# Patient Record
Sex: Female | Born: 1990 | Race: White | Hispanic: Yes | Marital: Married | Smoking: Never smoker
Health system: Southern US, Community
[De-identification: ages and names within clinical notes are randomized; demographics above are authoritative.]

## PROBLEM LIST (undated history)

## (undated) ENCOUNTER — Inpatient Hospital Stay (HOSPITAL_COMMUNITY): Payer: Self-pay

## (undated) DIAGNOSIS — Z789 Other specified health status: Secondary | ICD-10-CM

## (undated) HISTORY — PX: NO PAST SURGERIES: SHX2092

---
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
## 2011-02-16 LAB — CBC
Hemoglobin: 10.3 g/dL — ABNORMAL LOW (ref 12.0–15.0)
Hemoglobin: 7.6 g/dL — ABNORMAL LOW (ref 12.0–15.0)
MCH: 24.9 pg — ABNORMAL LOW (ref 26.0–34.0)
MCH: 24.9 pg — ABNORMAL LOW (ref 26.0–34.0)
MCHC: 32.8 g/dL (ref 30.0–36.0)
MCV: 75.2 fL — ABNORMAL LOW (ref 78.0–100.0)
Platelets: 220 10*3/uL (ref 150–400)
RBC: 3.07 MIL/uL — ABNORMAL LOW (ref 3.87–5.11)
RBC: 4.14 MIL/uL (ref 3.87–5.11)
RDW: 16.5 % — ABNORMAL HIGH (ref 11.5–15.5)
WBC: 6 10*3/uL (ref 4.0–10.5)

## 2011-03-05 LAB — URINALYSIS, ROUTINE W REFLEX MICROSCOPIC
Ketones, ur: NEGATIVE mg/dL
Nitrite: NEGATIVE
Protein, ur: NEGATIVE mg/dL

## 2011-03-05 LAB — POCT URINALYSIS DIP (DEVICE)
Bilirubin Urine: NEGATIVE
Glucose, UA: NEGATIVE mg/dL
Hgb urine dipstick: NEGATIVE
Ketones, ur: NEGATIVE mg/dL
Specific Gravity, Urine: 1.015 (ref 1.005–1.030)
pH: 8.5 — ABNORMAL HIGH (ref 5.0–8.0)

## 2011-03-05 LAB — GC/CHLAMYDIA PROBE AMP, GENITAL: GC Probe Amp, Genital: NEGATIVE

## 2011-04-18 NOTE — Discharge Summary (Signed)
NAME:  Destiny Horn, Destiny Horn                     ACCOUNT NO.:  [id number]   MEDICAL RECORD NO.:  [id number]                   PATIENT TYPE:  INP   LOCATION:  0105                                 FACILITY:  [facility]   PHYSICIAN:  Beverly Milch, MD                  DATE OF BIRTH:  01/31/1991   DATE OF ADMISSION:  06/11/2004  DATE OF DISCHARGE:  06/17/2004                                 DISCHARGE SUMMARY   IDENTIFICATION:  A 20 year old female who completed 5th grade an [employer] is admitted emergently, voluntarily on referral from Dr. Marda Stalker for inpatient stabilization of command auditory hallucinations to  kill herself or others.  The patient had been in therapy for approximately 3  months with Lauris Poag, M.A., LCSW, approximately weekly.  Dr.  Orson Aloe follows the patient closely and the patient is medically stable  and on no medications.  There is significant dynamic conflicts in the family  structure and system, with father leaving the family 6 years ago as an  alcoholic, and the patient missing him and mother likely overwhelmed as  father will not pay child support.  The patient feels somewhat unattended by  mother, having to provide some parenting to the younger siblings herself,  though the aunt tries to support the patient.  For full details please see  the typed admission assessment.   SYNOPSIS OF PRESENT ILLNESS:  Mother does not speak English but the patient  is bilingual.  The patient apparently fights frequently with 35 year old  sister and is closest to 5-year-old brother.  The patient seems depressed at  the time of admission to mother and maternal uncle who lives with the  family.  Mother does not acknowledge anxiety but seems to have significant  anxiety.  The patient is obsessive about the murder death of the Latin music  star Selina and stars at pictures and listens to music, sometimes for hours.  The patient appears to have learning  difficulties and/or attention deficit.  The patient has suggested that the command auditory hallucinations that are  getting worse are of deceased relatives, though mother's faith healing  undertaking was not successful.  Mother seems to question whether the  patient may be possessed at times, though also subsequently state that some  of the hallucinations seem to stem from  the killing of selina.  The patient  does not appear to seek secondary gain with her symptoms and in fact she and  mother want her out of the hospital, though they organize some reunification  in their relationship around this process.  Mother worries that the patient  may have lead poisoning.  Maternal aunt has anxiety and maternal uncle has  required inpatient psychiatric treatment.   INITIAL MENTAL STATUS EXAM:  The patient has gingival prominence and widely  spaced teeth with dental malocclusion that may have social consequences.  The patient is somewhat hesitant in her verbal  interpersonal style and has  intermittent dysphoria but not consistently.  She does seem to have do and  learning difficulties clinically.  The patient is not disruptive and denies  any substance use.  She has had suicide and homicidal ideation associated  with her command auditory hallucinations, and fights physically with her  sister.   LABORATORY FINDINGS:  CBC on admission revealed white count low at 3900 with  reference range 48-12000 and showed 33% neutrophils on differential so that  absolute neutrophils were relative low at 1300, with reference range 1700 -  6800.  There were 9%eosinophils, with reference range 0-5%, though absolute  eosinophil count was still normal at 400.  Hemoglobin was normal at 13.4,  [place] at 81 and platelet count 333,000.  Comprehensive metabolic panel  was normal, with sodium 138, potassium 3.7, glucose  97, creatinine 0.5,  calcium 9.6, albumin 3.9, AST 22 and ALT 14, though alkaline phosphatase was   elevated for adult norms at 173, with adult reference range 39-117  consistent with some remaining growth potential.  GGT was normal at 8.  TSH  was normal at 3.523.  Urine pregnancy test was negative.  Blood lead was  less than 2 mcg/dl and thereby normal, with reference range being less than  10.  A copy was provided mother.  Urine drug screen was negative.  Urinalysis was normal, with specific gravity of 1.010, pH 7, trace of  leukocyte esterase, 2 epithelial, 0-2 WBC and no bacteria.  RPR was  nonreactive.  Urine probes for gonorrhea and chlamydia trichomatous by DNA  amplification were both negative.   HOSPITAL COURSE AND TREATMENT:  General medical exam by [facility],  PA-C noted no medication allergies.  The patient had some vague headache at  the time of her exam with no abnormalities found.  She suggested frequent  stomach ache and noted menarche in June of 2005.  The exam questioned  whether there might be any history for seizures.  She was Tanner stage 5.  The need for dental care was acknowledged and a question of minimal  scoliosis was raised on her exam.  Admission height was 61.5 inches and  weight was 103 pounds.  The patient detested the food at the hospital and  stated it made her nauseous.  Her discharge weight was 98.5 pounds.  Blood  pressure was 127/74 with heart rate of 96 on admission.  Vital signs were  normal throughout hospital stay and discharge blood pressure was 117/62 with  heart rate of 115 and standing blood pressure 106/65 with heart rate of 116.  The patient was provided Remeron 15 mg the first night of hospitalization  but it did not facilitate sleep or reduction in anxiety or improvement in  mood.  She was changed to Abilify as psychiatric exam was most consistent  over time with a schizophreniform disorder.  The patient did not manifest a  persistent major depression nor did she manifest definite criteria for schizophrenia.  I discussed her  case with Dr. Orson Aloe and exchanged  information with Lauris Poag.  Dr. Mariana Single saw the patient the two days  preceding the day of discharge and agreed with the differential diagnosis  and formulation.  The patient appears much more symptomatic when stressed  relationally with the family.  She was paranoid including with peers, though  she would have times of warm and rewarding relationships with older peers.  She had particular difficulty at night though would not open up and  discuss  the hallucinations, even more seeming to be seeking every way she could to  accomplish discharge.  Abilify was started at 10 mg nightly and titrated up  to 15 mg nightly 3 days prior to discharge.  She tolerated the medication  well after having some initial sleepiness from it.  She had no side effects  by the time of discharge.  She became more patient and less paranoid  Although she stated that the hallucinations were not there, she still  manifested paranoia 3 days prior to discharge.  The patient had a difficult  time 2 days prior to discharge, being disorganized in the milieu.  However  over the final 48 hours of hospitalization, the patient was much more  capable.  She talked about nightmares of seeing Selina being killed by  [facility], covered with blood.  She noted the resolution of auditory  hallucinations 2 days prior to discharge and continued to manifest  improvement over the subsequent 48 hours gradually.  Mother was pleased with  the patient's outcome, even though reporting that she herself was very  stressed when the patient was demanding and crying for discharge.  Mother  and patient matured in emotional problem solving capability and generalized  this to the family.  The patient identified that she was very talented  Secretary/administrator and can organize the direction of her education by that.  The  patient did document to mother improved skills for coping, including dealing  with hallucinations  should they recur, for safety and maintenance of family  relations and communication.  She was discharged in improved condition,  having participated in all aspects of active inpatient treatment, including  group, milieu, behavioral, individual, family, special education, anger  management, occupational and therapeutic recreational therapies.  She was  planned psychiatric follow-up after discharge at the [facility], mother  seeking Spanish-speaking psychiatrist in the community in any way possible.   FINAL DIAGNOSES:  AXIS 1:  1. Schizophreniform disorder.  2. Attention deficit hyperactivity disorder, predominantly inattentive type,     moderate severity.  3. Rule out generalized anxiety disorder (provisional diagnosis).  4. Parent-child problem.  5. Other specified family circumstances.  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  AXIS III:  1. Dental malocclusion.  2. Borderline granulocytopenia of doubtful significance. 3. Immediately pubertal.  4. No ongoing clinical concern for headache, scoliosis or convulsive     diathesis.  AXIS IV:  Stressors:  Family - severe, acute and chronic; school - severe, acute and  chronic; phase of life - severe, acute and chronic.  AXIS V:  Global assessment of function on admission 28 with highest in last year 62  and discharge global assessment of function was 53.   PLAN:  The patient and mother were pleased with the patient's outcome though  maintaining their participation in sustained treatment was very challenging.  This would predict that compliance may be a problem.  They were provided  samples of Abilify 15 mg tablet every bedtime, quantity #21 with a  prescription for #30.  The patient is on no other medication at the time of  discharge and is no longer somatic immediately at the time of discharge.  Of  her somatic concerns, addressing orthodontic needs would be the most  prominent.  Crisis and safety plans are outlined  if needed.  Weight  maintenance diet was addressed, and she has no restrictions on physical  activity.  She has psychiatric follow-up with the [facility] June 17, 2004 at 0900.  She will have ongoing individual and family therapy with  Lauris Poag, M.A., LCSW.  Ultimately family therapy may be most  important for reducing the patient's stress.  The patient's obsessive  fixation on the death of Kara Dies is not otherwise clarified and the  differential diagnosis over time between schizophreniform, psychotic and  anxiety disorder remains to be clarified in aftercare though the patient is  making some significant improvement on Abilify thus far.  She and mother  were educated on the medication as possible, including written guidelines.                                               Beverly Milch, MD    GJ/MEDQ  D:  06/18/2004  T:  06/18/2004  Job:  [id number]   cc:   Lauris Poag, M.A., LCSW  [address], [address]  [place], [place] [address]  [phone]   [facility]  [address] [place], [place]   Marda Stalker  Phone:  [phone]

## 2011-04-18 NOTE — H&P (Signed)
NAME:  Destiny Horn, Destiny Horn                     ACCOUNT NO.:  [id number]   MEDICAL RECORD NO.:  [id number]                   PATIENT TYPE:  INP   LOCATION:  0105                                 FACILITY:  [facility]   PHYSICIAN:  Beverly Milch, MD                  DATE OF BIRTH:  [birth year]   DATE OF ADMISSION:  06/11/2004  DATE OF DISCHARGE:                         PSYCHIATRIC ADMISSION ASSESSMENT   IDENTIFICATION:  20 year old female who has apparently completed 5th grade  at [employer] is admitted emergently, voluntarily on referral  from her pediatrician Dr. Orson Aloe for inpatient stabilization of auditory  hallucinations that command her to kill herself or others.  The patient  appears depressed and traumatized.  She reports attention deficit and  learning disorder which are untreated.  She is alienated and suspicious,  including not opening up and talking to mother, though feeling somewhat  unloved by mother.  Parents are separated with father not paying child  support and aunt and uncles trying to be supportive.   HISTORY OF PRESENT ILLNESS:  Mother does not speak [place].  The patient  does speak [place] and [place].  Mother can provide little detail and the  patient does not talk to her mother.  An aunt does know much more about how  the patient is doing and is worried that the patient feels unloved by mother  and therefore does not talk to her.  Mother seems to indicate that the  patient is expected to take care of the 38 and 24 year old siblings because  mother is working and they receive no child support, which likely explains  why the patient feels distant from mother.  The patient fights in a love-  hate way with her 77 year old sister.  The 7-year-old brother is closer to  the patient.  The father left 6 years, as an alcoholic, and the patient  misses him significantly and severely.  Mother and maternal uncle who lives  in the home seem depressed over the patient  or over the family problems.  The patient has seen Macarthur Critchley, M.A., LCSW, weekly for the last 3  months however the patient is not making progress.  She suggests that her  auditory hallucinations commanding her to harm or kill herself or others  have been the most disconcerting of her symptoms and pervasive.  These seem  to depressed her and to undermine social relations.  The patient notes  paranoia and she does not trust others.  She has a difficult time being  alone and suggests she has not been separated from mother in the past.  The  patient suggests that others do not accept her or like her and she seems  somewhat odd and eccentric.  She does not acknowledge depression prior to  the auditory hallucinations.  She was interpreted by mother and aunt as  being deeply depressed and nursing staff at the time of admission wonder if  the patient was depressed and possibly traumatized such as by sexual abuse.  The patient does not acknowledge any definite sexual maltreatment or other  maltreatment and mother and aunt are not aware of any abuse either.  The  patient has not had exposure to alcohol or illicit drugs.  She does not  smoke cigarettes.  She is somewhat disheveled and is not meeting her basic  needs in daily life.  She seems overwhelmed trying to take care of younger  siblings.   PAST MEDICAL HISTORY:  The patient is under the primary care of Dr.  Orson Aloe.  Mother is afraid the patient may have lead poisoning as there is  much peeling paint in their environment.  The patient seems to have some  dental malocclusion.  She had chicken pox in the past.  Last menses was June 06, 2004 so that she is pubertal.  Has has no medication allergies.  She is  on no current medications.  The patient is having significant difficulty  sleeping but she is frightened at night.  She denies seizures or syncope.  She denies heart murmur or arrythmia.  She has no known toxin exposure.   REVIEW  OF SYSTEMS:  The patient denies any difficulty with gait, gaze or  continence.  She denies exposure to communicable disease or toxins.  She  does not manifest delirium symptoms including through her pediatrician.  She  has no other active medical concerns.  She has no cough, congestion or chest  pain.  There is no abdominal pain, nausea, vomiting or diarrhea.  There is  no dysuria or arthralgia.  The patient has no visual hallucinations.  Immunizations are up to date.   FAMILY HISTORY:  The patient resides with mother and apparently has to care  for the 57-year-old brother and 53 year old sister, though she has a love-  hate relationship with the 58 year old sister.  Mother apparently works and  does not receive child support from the alcoholic father who left when the  patient was 7 and does not pay the $400 monthly child support.  All of the  children are born in this country.  An aunt helps mother and seems to know  more about the patient than the mother.  She aunt notes that the patient  will not talk to mother.  There is a maternal uncle living in the home but  he and mother are depressed over the home and the patient.  The patient  fights with her 46 year old sister.  Maternal aunt has anxiety.  A paternal  uncle had inpatient psychiatric treatment.  The patient will not open up and  describe or clarify the auditory hallucinations any further.  She cannot  clarify the characteristics of the voices.  The patient states she has  significant concentration and cannot learn very well in that way.  The aunt  states the patient has significant learning difficulties in addition.   SOCIAL AND DEVELOPMENTAL HISTORY:  There were no definite complications or  consequences of gestation, delivery, or neonatal period.  The patient  appears to have some gingival prominence and dental malocclusion, with teeth  widely spaced and appearing slightly small.  The patient is reportedly obsessed with the  music and pictures of Theadora Rama and gets upset when the aunt  or mother take these away when the patient is so obsessed and staring at  them.  The patient has completed the 5th grade but is reportedly having  significant learning problems.  I  am not certain she is receiving special  educational assistance but she may be.  She reports that she just cannot  concentrate or pay attention, even before the auditory hallucinations.  She  does otherwise acknowledge any substance abuse.  She denies any sexual  activity or abuse herself.  She is most conflicted with mother.  She seems  to depend on mother, being scared without her but never having been  separated from her.  However at the same time she does not believe mother  loves her and she misses her father significantly.  She seems to have  significant internalized and interjected conflict about parents.   ASSETS:  The patient does show some social interest in peers and wants a  roommate.   MENTAL STATUS EXAM:  Height is 61.5 inches and weight is 103 pounds.  Blood  pressure 127/74 with heart rate of 96.  The patient is right handed.  She is  avoidant and hesitant to participate in the neurological exam.  AMRs appear  intact.  She is alert and oriented with cranial nerves intact.  Speech is  intact though she offers a paucity of spontaneous verbal communication.  Her  eye contact is not consistent and she appears somewhat odd and eccentric.  She has intact AMRs and DTRs.  Muscle strength and tone are normal.  There  are no abnormal involuntary movements.  There are no pathological reflexes  or soft neurologic findings.  Babinski is downgoing bilaterally.  Gait and  gaze are intact.  The patient consistently reports command auditory  hallucinations now to harm herself or others, including to kill herself or  others.  She reports auditory hallucinations over at least 3-6 months that  are becoming more socially and emotionally consequential.  She  becomes  dysphoric at times but not consistently.  She states the dysphoria has  started more recently.  She has had concentration and learning difficulties  that are more sustained than the auditory hallucinations and have been  significantly consequential at school.  She is not disruptive however.  She  does not describe significant impulsivity.  She does not describe other  impulse control or habit disorders.  She is not exhibiting any substance  abuse.  She has had suicide and homicidal ideation with command auditory  hallucinations to do so.  She is not assaultive or homicidal but does fight  with her sister.   IMPRESSION:  AXIS 1:  1. Schizophreniform disorder.  2. Attention deficit hyperactivity disorder, predominantly inattentive type,     moderate severity.  3. Rule out post-traumatic stress disorder (provisional diagnosis).  4. Rule out major depression, single episode, severe, with psychotic     features (provisional diagnosis). 5. Rule out schizoaffective disorder, depressed (provisional diagnosis).  6. Parent-child problem.  7. Other specified family circumstances.  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  AXIS III:  1. Dental malocclusion.  2. Borderline granulocytopenia of doubtful significance.  AXIS IV:  Stressors:  Family - moderate to severe, acute and chronic; school - severe,  acute and chronic; phase of life - severe, acute and chronic.  AXIS V:  Global assessment of function on admission 28 with highest in last year 62.   PLAN:  The patient is admitted for inpatient adolescent psychiatric and  multi-disciplinary, multi-modal behavioral health treatment in a team-based  program in a locked psychiatric unit.  Abilify is recommended and discussed  with the aunt who discusses with mother.  They make a family  decision that  they would approve of such.  The patient did receive Remeron 15 mg last  night but could not sleep even with the Remeron and  does not appear to have  a primary mood disorder to subsequent psychiatric assessment.  We will  discontinue Remeron at this time.  Psychotic symptoms appear more primary.  Will monitor mood, misperceptions, and attention span more closely.  Family  therapy and parent management training are planned.  Supportive therapies  and psychoeducation are planned along with social skills training and  communication skills.  Estimated length of stay is 7-10 days with target  symptoms for discharge being stabilization of psychotic symptoms,  stabilization of homicidal and suicidal ideation.  Clarification of mood and  inattention and primary treatment and generalization of the capacity for  safe and effective participation in outpatient treatment, including with Dr.  Susanne Greenhouse for therapy.                                               Beverly Milch, MD    GJ/MEDQ  D:  06/12/2004  T:  06/13/2004  Job:  [id number]

## 2011-12-02 NOTE — L&D Delivery Note (Signed)
Delivery Note At 8:18 PM a viable female was delivered via  (Presentation: LOA ;  ).  APGAR: 9 - 9, ; weight 6 lb 14.9 oz (3144 g).   Placenta status: intact, .  Cord: 3 vessel  with the following complications: none .  Cord pH: none  Anesthesia:  Epidural Episiotomy:   None Lacerations:   None Suture Repair: none Est. Blood Loss (mL): 350  Mom to postpartum.  Baby to nursery-stable.  Verlin Duke A 07/12/2012, 8:40 PM

## 2012-02-10 LAB — OB RESULTS CONSOLE HIV ANTIBODY (ROUTINE TESTING): HIV: NONREACTIVE

## 2012-02-10 LAB — OB RESULTS CONSOLE RPR: RPR: NONREACTIVE

## 2012-02-10 LAB — OB RESULTS CONSOLE RUBELLA ANTIBODY, IGM: Rubella: IMMUNE

## 2012-02-10 LAB — OB RESULTS CONSOLE ABO/RH: RH Type: POSITIVE

## 2012-03-02 ENCOUNTER — Other Ambulatory Visit: Payer: Self-pay | Admitting: Obstetrics & Gynecology

## 2012-03-02 DIAGNOSIS — Z3689 Encounter for other specified antenatal screening: Secondary | ICD-10-CM

## 2012-03-05 ENCOUNTER — Ambulatory Visit (HOSPITAL_COMMUNITY)
Admission: RE | Admit: 2012-03-05 | Discharge: 2012-03-05 | Disposition: A | Discharge status: Home / Self Care | Payer: Medicaid Other | Source: Ambulatory Visit | Attending: Obstetrics & Gynecology | Admitting: Obstetrics & Gynecology

## 2012-03-05 DIAGNOSIS — Z363 Encounter for antenatal screening for malformations: Secondary | ICD-10-CM | POA: Insufficient documentation

## 2012-03-05 DIAGNOSIS — Z3689 Encounter for other specified antenatal screening: Secondary | ICD-10-CM

## 2012-03-05 DIAGNOSIS — O358XX0 Maternal care for other (suspected) fetal abnormality and damage, not applicable or unspecified: Secondary | ICD-10-CM | POA: Insufficient documentation

## 2012-03-05 DIAGNOSIS — Z1389 Encounter for screening for other disorder: Secondary | ICD-10-CM | POA: Insufficient documentation

## 2012-04-16 ENCOUNTER — Inpatient Hospital Stay (HOSPITAL_COMMUNITY)
Admission: AD | Admit: 2012-04-16 | Discharge: 2012-04-16 | Disposition: A | Discharge status: Home / Self Care | Payer: Medicaid Other | Source: Ambulatory Visit | Attending: Obstetrics | Admitting: Obstetrics

## 2012-04-16 ENCOUNTER — Encounter (HOSPITAL_COMMUNITY): Payer: Self-pay

## 2012-04-16 DIAGNOSIS — B3731 Acute candidiasis of vulva and vagina: Secondary | ICD-10-CM | POA: Insufficient documentation

## 2012-04-16 DIAGNOSIS — R6250 Unspecified lack of expected normal physiological development in childhood: Secondary | ICD-10-CM | POA: Diagnosis present

## 2012-04-16 DIAGNOSIS — O4702 False labor before 37 completed weeks of gestation, second trimester: Secondary | ICD-10-CM

## 2012-04-16 DIAGNOSIS — O4700 False labor before 37 completed weeks of gestation, unspecified trimester: Secondary | ICD-10-CM

## 2012-04-16 DIAGNOSIS — O2390 Unspecified genitourinary tract infection in pregnancy, unspecified trimester: Secondary | ICD-10-CM | POA: Insufficient documentation

## 2012-04-16 DIAGNOSIS — O479 False labor, unspecified: Secondary | ICD-10-CM | POA: Insufficient documentation

## 2012-04-16 DIAGNOSIS — B373 Candidiasis of vulva and vagina: Secondary | ICD-10-CM | POA: Insufficient documentation

## 2012-04-16 HISTORY — DX: Other specified health status: Z78.9

## 2012-04-16 LAB — WET PREP, GENITAL
Clue Cells Wet Prep HPF POC: NONE SEEN
Trich, Wet Prep: NONE SEEN
Yeast Wet Prep HPF POC: NONE SEEN

## 2012-04-16 LAB — URINALYSIS, ROUTINE W REFLEX MICROSCOPIC
Bilirubin Urine: NEGATIVE
Glucose, UA: NEGATIVE mg/dL
Hgb urine dipstick: NEGATIVE
Protein, ur: NEGATIVE mg/dL

## 2012-04-16 MED ORDER — FLUCONAZOLE 150 MG PO TABS
150.0000 mg | ORAL_TABLET | Freq: Once | ORAL | Status: AC
  Administered 2012-04-16: 150 mg via ORAL
  Filled 2012-04-16: qty 1

## 2012-04-16 MED ORDER — NIFEDIPINE 10 MG PO CAPS
10.0000 mg | ORAL_CAPSULE | ORAL | Status: DC | PRN
  Administered 2012-04-16 (×3): 10 mg via ORAL
  Filled 2012-04-16 (×3): qty 1

## 2012-04-16 NOTE — MAU Provider Note (Signed)
Chief Complaint:  Rupture of Membranes   First Provider Initiated Contact with Patient 04/16/12 1433      HPI  Destiny Horn is a 21 y.o. G2P1001 at [redacted]w[redacted]d presenting with LOF since 04/12/12, "baby balling up" and feeling as if her vagina is "cracking". She denies vaginal bleeding, fever, chills, abd pain ir UTI Sx. Last IC weeks ago.Good fetal movement.   Pregnancy Course: No Hx PTL or PTD  Past Medical History: Past Medical History  Diagnosis Date  . No pertinent past medical history     Past Surgical History: History reviewed. No pertinent past surgical history.  Family History: Family History  Problem Relation Age of Onset  . Anesthesia problems Neg Hx   . Hypotension Neg Hx   . Malignant hyperthermia Neg Hx   . Pseudochol deficiency Neg Hx     Social History: History  Substance Use Topics  . Smoking status: Never Smoker   . Smokeless tobacco: Not on file  . Alcohol Use: No    Allergies: No Known Allergies  Meds:  Prescriptions prior to admission  Medication Sig Dispense Refill  . Prenatal Vit-Fe Fumarate-FA (PRENATAL MULTIVITAMIN) TABS Take 1 tablet by mouth 2 (two) times daily.          Physical Exam  Blood pressure 122/57, pulse 104, temperature 99.9 F (37.7 C), temperature source Oral, resp. rate 16, SpO2 98.00%. GENERAL: Well-developed, well-nourished female in no acute distress. Slowed mentation. HEENT: normocephalic HEART: normal rate RESP: normal effort ABDOMEN: Soft, nontender, nondistended, gravid.  EXTREMITIES: Nontender, no edema NEURO: alert and oriented  SPECULUM EXAM: Neg pool, moderate amount of creamy, odorless discharge, n Dilation: Closed Effacement (%): Thick Cervical Position: Posterior Exam by:: Ivonne Andrew, CNM  FHT:  Baseline 140 , moderate variability, accelerations present, no decelerations Contractions: q 2-4 mins, mild   Labs: Results for orders placed during the hospital encounter of 04/16/12 (from the past 24  hour(s))  WET PREP, GENITAL     Status: Abnormal   Collection Time   04/16/12  2:45 PM      Component Value Range   Yeast Wet Prep HPF POC NONE SEEN  NONE SEEN    Trich, Wet Prep NONE SEEN  NONE SEEN    Clue Cells Wet Prep HPF POC NONE SEEN  NONE SEEN    WBC, Wet Prep HPF POC MODERATE (*) NONE SEEN   FETAL FIBRONECTIN     Status: Normal   Collection Time   04/16/12  2:45 PM      Component Value Range   Fetal Fibronectin NEGATIVE  NEGATIVE   URINALYSIS, ROUTINE W REFLEX MICROSCOPIC     Status: Abnormal   Collection Time   04/16/12  2:45 PM      Component Value Range   Color, Urine STRAW (*) YELLOW    APPearance CLEAR  CLEAR    Specific Gravity, Urine <1.005 (*) 1.005 - 1.030    pH 7.0  5.0 - 8.0    Glucose, UA NEGATIVE  NEGATIVE (mg/dL)   Hgb urine dipstick NEGATIVE  NEGATIVE    Bilirubin Urine NEGATIVE  NEGATIVE    Ketones, ur NEGATIVE  NEGATIVE (mg/dL)   Protein, ur NEGATIVE  NEGATIVE (mg/dL)   Urobilinogen, UA 0.2  0.0 - 1.0 (mg/dL)   Nitrite NEGATIVE  NEGATIVE    Leukocytes, UA NEGATIVE  NEGATIVE    UC's resolved w/ Procardia 10 mg x 4 doses.  Imaging:  NA  Assessment: 1. False labor before 37 completed weeks  of gestation, second trimester   2. Vaginal yeast infection    Plan: D/C home PTL precautions Pelvic rest x 1 week Follow-up Information    Follow up with HARPER,CHARLES A, MD. Schedule an appointment as soon as possible for a visit on 04/19/2012.   Contact information:   [address] [place] [address] [phone]       Follow up with [facility]. (As needed if symptoms worsen)    Contact information:   [address] [place] [address] [phone]        Medication List  As of 04/16/2012  5:28 PM   CONTINUE taking these medications         prenatal multivitamin Tabs          OTC Monastat  Sheena Donegan 5/17/20132:51 PM

## 2012-04-16 NOTE — MAU Note (Signed)
Patient states she has been leaking clear fluid since 5-13. States she feels like something is "cracking" in the vaginal area. Denies any bleeding and reports good fetal movement.

## 2012-04-16 NOTE — Discharge Instructions (Signed)
Candidal Vulvovaginitis Candidal vulvovaginitis is an infection of the vagina and vulva. The vulva is the skin around the opening of the vagina. This may cause itching and discomfort in and around the vagina.  HOME CARE  Only take medicine as told by your doctor.   Do not have sex (intercourse) until the infection is healed or as told by your doctor.   Practice safe sex.   Tell your sex partner about your infection.   Do not douche or use tampons.   Wear cotton underwear. Do not wear tight pants or panty hose.   Eat yogurt. This may help treat and prevent yeast infections.  GET HELP RIGHT AWAY IF:   You have a fever.   Your problems get worse during treatment or do not get better in 3 days.   You have discomfort, irritation, or itching in your vagina or vulva area.   You have pain after sex.   You start to get belly (abdominal) pain.  MAKE SURE YOU:  Understand these instructions.   Will watch your condition.   Will get help right away if you are not doing well or get worse.  Document Released: 02/13/2009 Document Revised: 11/06/2011 Document Reviewed: 02/13/2009 [employer] Patient Information 2012 [employer]. 

## 2012-04-17 LAB — GC/CHLAMYDIA PROBE AMP, GENITAL
Chlamydia, DNA Probe: NEGATIVE
GC Probe Amp, Genital: NEGATIVE

## 2012-06-04 LAB — OB RESULTS CONSOLE GBS: GBS: NEGATIVE

## 2012-06-23 ENCOUNTER — Other Ambulatory Visit: Payer: Self-pay | Admitting: Nurse Practitioner

## 2012-06-23 ENCOUNTER — Ambulatory Visit (HOSPITAL_COMMUNITY)
Admission: RE | Admit: 2012-06-23 | Discharge: 2012-06-23 | Disposition: A | Discharge status: Home / Self Care | Payer: Medicaid Other | Source: Ambulatory Visit | Attending: Nurse Practitioner | Admitting: Nurse Practitioner

## 2012-06-23 DIAGNOSIS — O321XX0 Maternal care for breech presentation, not applicable or unspecified: Secondary | ICD-10-CM

## 2012-06-23 DIAGNOSIS — Z3689 Encounter for other specified antenatal screening: Secondary | ICD-10-CM | POA: Insufficient documentation

## 2012-07-08 ENCOUNTER — Telehealth (HOSPITAL_COMMUNITY): Payer: Self-pay | Admitting: *Deleted

## 2012-07-08 ENCOUNTER — Encounter (HOSPITAL_COMMUNITY): Payer: Self-pay | Admitting: *Deleted

## 2012-07-08 ENCOUNTER — Other Ambulatory Visit: Payer: Self-pay | Admitting: Obstetrics

## 2012-07-08 NOTE — Telephone Encounter (Signed)
Preadmission screen  

## 2012-07-12 ENCOUNTER — Encounter (HOSPITAL_COMMUNITY): Payer: Self-pay | Admitting: Anesthesiology

## 2012-07-12 ENCOUNTER — Encounter (HOSPITAL_COMMUNITY): Payer: Self-pay

## 2012-07-12 ENCOUNTER — Inpatient Hospital Stay (HOSPITAL_COMMUNITY)
Admission: RE | Admit: 2012-07-12 | Discharge: 2012-07-14 | DRG: 775 | Disposition: A | Discharge status: Home / Self Care | Payer: Medicaid Other | Source: Ambulatory Visit | Attending: Obstetrics | Admitting: Obstetrics

## 2012-07-12 ENCOUNTER — Inpatient Hospital Stay (HOSPITAL_COMMUNITY): Payer: Medicaid Other | Admitting: Anesthesiology

## 2012-07-12 DIAGNOSIS — R6250 Unspecified lack of expected normal physiological development in childhood: Secondary | ICD-10-CM

## 2012-07-12 DIAGNOSIS — O480 Post-term pregnancy: Principal | ICD-10-CM | POA: Diagnosis present

## 2012-07-12 LAB — ABO/RH: ABO/RH(D): A POS

## 2012-07-12 LAB — CBC
MCH: 23.4 pg — ABNORMAL LOW (ref 26.0–34.0)
MCHC: 31.7 g/dL (ref 30.0–36.0)
Platelets: 290 10*3/uL (ref 150–400)
RDW: 15.3 % (ref 11.5–15.5)

## 2012-07-12 LAB — TYPE AND SCREEN

## 2012-07-12 MED ORDER — ONDANSETRON HCL 4 MG/2ML IJ SOLN: 4.0000 mg | INTRAMUSCULAR | Status: DC | PRN

## 2012-07-12 MED ORDER — MISOPROSTOL 25 MCG QUARTER TABLET: 25.0000 ug | ORAL_TABLET | ORAL | Status: DC | PRN

## 2012-07-12 MED ORDER — PROMETHAZINE HCL 25 MG/ML IJ SOLN: 25.0000 mg | Freq: Four times a day (QID) | INTRAMUSCULAR | Status: DC | PRN

## 2012-07-12 MED ORDER — MISOPROSTOL 25 MCG QUARTER TABLET
25.0000 ug | ORAL_TABLET | ORAL | Status: DC | PRN
  Administered 2012-07-12 (×2): 25 ug via VAGINAL
  Filled 2012-07-12 (×2): qty 0.25

## 2012-07-12 MED ORDER — LANOLIN HYDROUS EX OINT: TOPICAL_OINTMENT | CUTANEOUS | Status: DC | PRN

## 2012-07-12 MED ORDER — SIMETHICONE 80 MG PO CHEW: 80.0000 mg | CHEWABLE_TABLET | ORAL | Status: DC | PRN

## 2012-07-12 MED ORDER — OXYTOCIN BOLUS FROM INFUSION
250.0000 mL | Freq: Once | INTRAVENOUS | Status: DC
  Filled 2012-07-12: qty 500

## 2012-07-12 MED ORDER — CITRIC ACID-SODIUM CITRATE 334-500 MG/5ML PO SOLN: 30.0000 mL | ORAL | Status: DC | PRN

## 2012-07-12 MED ORDER — TERBUTALINE SULFATE 1 MG/ML IJ SOLN: 0.2500 mg | Freq: Once | INTRAMUSCULAR | Status: DC | PRN

## 2012-07-12 MED ORDER — DIBUCAINE 1 % RE OINT: 1.0000 "application " | TOPICAL_OINTMENT | RECTAL | Status: DC | PRN

## 2012-07-12 MED ORDER — IBUPROFEN 600 MG PO TABS
600.0000 mg | ORAL_TABLET | Freq: Four times a day (QID) | ORAL | Status: DC
  Administered 2012-07-12 – 2012-07-14 (×6): 600 mg via ORAL
  Filled 2012-07-12 (×6): qty 1

## 2012-07-12 MED ORDER — LACTATED RINGERS IV SOLN
INTRAVENOUS | Status: DC
  Administered 2012-07-12 (×3): via INTRAVENOUS

## 2012-07-12 MED ORDER — EPHEDRINE 5 MG/ML INJ
10.0000 mg | INTRAVENOUS | Status: DC | PRN
  Filled 2012-07-12: qty 4

## 2012-07-12 MED ORDER — EPHEDRINE 5 MG/ML INJ: 10.0000 mg | INTRAVENOUS | Status: DC | PRN

## 2012-07-12 MED ORDER — PRENATAL MULTIVITAMIN CH
1.0000 | ORAL_TABLET | Freq: Every day | ORAL | Status: DC
  Administered 2012-07-13: 1 via ORAL
  Filled 2012-07-12: qty 1

## 2012-07-12 MED ORDER — MEDROXYPROGESTERONE ACETATE 150 MG/ML IM SUSP: 150.0000 mg | INTRAMUSCULAR | Status: DC | PRN

## 2012-07-12 MED ORDER — OXYTOCIN 40 UNITS IN LACTATED RINGERS INFUSION - SIMPLE MED
62.5000 mL/h | Freq: Once | INTRAVENOUS | Status: DC
  Filled 2012-07-12: qty 1000

## 2012-07-12 MED ORDER — NALBUPHINE HCL 10 MG/ML IJ SOLN
10.0000 mg | Freq: Four times a day (QID) | INTRAMUSCULAR | Status: DC | PRN
  Filled 2012-07-12: qty 1

## 2012-07-12 MED ORDER — WITCH HAZEL-GLYCERIN EX PADS: 1.0000 "application " | MEDICATED_PAD | CUTANEOUS | Status: DC | PRN

## 2012-07-12 MED ORDER — PHENYLEPHRINE 40 MCG/ML (10ML) SYRINGE FOR IV PUSH (FOR BLOOD PRESSURE SUPPORT): 80.0000 ug | PREFILLED_SYRINGE | INTRAVENOUS | Status: DC | PRN

## 2012-07-12 MED ORDER — ONDANSETRON HCL 4 MG/2ML IJ SOLN: 4.0000 mg | Freq: Four times a day (QID) | INTRAMUSCULAR | Status: DC | PRN

## 2012-07-12 MED ORDER — LIDOCAINE HCL (PF) 1 % IJ SOLN
INTRAMUSCULAR | Status: DC | PRN
  Administered 2012-07-12 (×2): 4 mL

## 2012-07-12 MED ORDER — FLEET ENEMA 7-19 GM/118ML RE ENEM: 1.0000 | ENEMA | RECTAL | Status: DC | PRN

## 2012-07-12 MED ORDER — TETANUS-DIPHTH-ACELL PERTUSSIS 5-2.5-18.5 LF-MCG/0.5 IM SUSP
0.5000 mL | Freq: Once | INTRAMUSCULAR | Status: AC
  Administered 2012-07-13: 0.5 mL via INTRAMUSCULAR
  Filled 2012-07-12: qty 0.5

## 2012-07-12 MED ORDER — ACETAMINOPHEN 325 MG PO TABS: 650.0000 mg | ORAL_TABLET | ORAL | Status: DC | PRN

## 2012-07-12 MED ORDER — LIDOCAINE HCL (PF) 1 % IJ SOLN
30.0000 mL | INTRAMUSCULAR | Status: DC | PRN
  Filled 2012-07-12: qty 30

## 2012-07-12 MED ORDER — OXYCODONE-ACETAMINOPHEN 5-325 MG PO TABS: 1.0000 | ORAL_TABLET | ORAL | Status: DC | PRN

## 2012-07-12 MED ORDER — PHENYLEPHRINE 40 MCG/ML (10ML) SYRINGE FOR IV PUSH (FOR BLOOD PRESSURE SUPPORT)
80.0000 ug | PREFILLED_SYRINGE | INTRAVENOUS | Status: DC | PRN
  Filled 2012-07-12: qty 5

## 2012-07-12 MED ORDER — IBUPROFEN 600 MG PO TABS: 600.0000 mg | ORAL_TABLET | Freq: Four times a day (QID) | ORAL | Status: DC | PRN

## 2012-07-12 MED ORDER — BENZOCAINE-MENTHOL 20-0.5 % EX AERO: 1.0000 "application " | INHALATION_SPRAY | CUTANEOUS | Status: DC | PRN

## 2012-07-12 MED ORDER — LACTATED RINGERS IV SOLN: 500.0000 mL | INTRAVENOUS | Status: DC | PRN

## 2012-07-12 MED ORDER — SENNOSIDES-DOCUSATE SODIUM 8.6-50 MG PO TABS
2.0000 | ORAL_TABLET | Freq: Every day | ORAL | Status: DC
  Administered 2012-07-12 – 2012-07-13 (×2): 2 via ORAL

## 2012-07-12 MED ORDER — LACTATED RINGERS IV SOLN
500.0000 mL | Freq: Once | INTRAVENOUS | Status: AC
  Administered 2012-07-12: 1000 mL via INTRAVENOUS

## 2012-07-12 MED ORDER — DIPHENHYDRAMINE HCL 50 MG/ML IJ SOLN: 12.5000 mg | INTRAMUSCULAR | Status: DC | PRN

## 2012-07-12 MED ORDER — ZOLPIDEM TARTRATE 5 MG PO TABS: 5.0000 mg | ORAL_TABLET | Freq: Every evening | ORAL | Status: DC | PRN

## 2012-07-12 MED ORDER — DIPHENHYDRAMINE HCL 25 MG PO CAPS: 25.0000 mg | ORAL_CAPSULE | Freq: Four times a day (QID) | ORAL | Status: DC | PRN

## 2012-07-12 MED ORDER — ONDANSETRON HCL 4 MG PO TABS: 4.0000 mg | ORAL_TABLET | ORAL | Status: DC | PRN

## 2012-07-12 MED ORDER — NALBUPHINE HCL 10 MG/ML IJ SOLN
10.0000 mg | INTRAMUSCULAR | Status: DC | PRN
  Filled 2012-07-12: qty 1

## 2012-07-12 MED ORDER — FENTANYL 2.5 MCG/ML BUPIVACAINE 1/10 % EPIDURAL INFUSION (WH - ANES)
INTRAMUSCULAR | Status: DC | PRN
  Administered 2012-07-12: 14 mL/h via EPIDURAL

## 2012-07-12 MED ORDER — FENTANYL 2.5 MCG/ML BUPIVACAINE 1/10 % EPIDURAL INFUSION (WH - ANES)
14.0000 mL/h | INTRAMUSCULAR | Status: DC
  Filled 2012-07-12: qty 60

## 2012-07-12 MED ORDER — OXYCODONE-ACETAMINOPHEN 5-325 MG PO TABS
1.0000 | ORAL_TABLET | ORAL | Status: DC | PRN
  Administered 2012-07-13 (×2): 1 via ORAL
  Filled 2012-07-12 (×2): qty 1

## 2012-07-12 MED ORDER — OXYTOCIN 40 UNITS IN LACTATED RINGERS INFUSION - SIMPLE MED: 62.5000 mL/h | INTRAVENOUS | Status: DC | PRN

## 2012-07-12 NOTE — H&P (Signed)
Destiny Horn is a 21 y.o. female presenting for IOL. Maternal Medical History:  Reason for admission: 21 yo G2 P1   EDC 8-7 13.  Presents for IOL for postdates.  Fetal activity: Perceived fetal activity is normal.   Last perceived fetal movement was within the past hour.    Prenatal complications: no prenatal complications Prenatal Complications - Diabetes: none.    OB History    Grav Para Term Preterm Abortions TAB SAB Ect Mult Living   2 1 1  0 0 0 0 0 0 1     Past Medical History  Diagnosis Date  . No pertinent past medical history    Past Surgical History  Procedure Date  . No past surgeries    Family History: family history is negative for Anesthesia problems, and Hypotension, and Malignant hyperthermia, and Pseudochol deficiency, . Social History:  reports that she has never smoked. She has never used smokeless tobacco. She reports that she does not drink alcohol or use illicit drugs.   Prenatal Transfer Tool  Maternal Diabetes: No Genetic Screening: Normal Maternal Ultrasounds/Referrals: Normal Fetal Ultrasounds or other Referrals:  None Maternal Substance Abuse:  No Significant Maternal Medications:  Meds include: Other: see prenatal record Significant Maternal Lab Results:     GBS negative                                                                                                                                                                                                                                                                                                                                                                                          Other Comments:  None  Review of Systems  All other systems reviewed and are negative.      Height 5\' 3"  (1.6 m), last menstrual period 10/01/2011. Maternal Exam:  Abdomen: Patient reports no abdominal tenderness. Introitus: Normal vulva. Normal vagina.    Physical Exam  Nursing note and vitals  reviewed. Constitutional: She is oriented to person, place, and time. She appears well-developed and well-nourished.  HENT:  Head: Normocephalic and atraumatic.  Eyes: Conjunctivae are normal. Pupils are equal, round, and reactive to light.  Neck: Normal range of motion. Neck supple.  Cardiovascular: Normal rate and regular rhythm.   Respiratory: Effort normal and breath sounds normal.  GI: Soft.  Genitourinary: Vagina normal and uterus normal.  Musculoskeletal: Normal range of motion.  Neurological: She is alert and oriented to person, place, and time.  Skin: Skin is warm and dry.  Psychiatric: She has a normal mood and affect. Her behavior is normal. Judgment and thought content normal.    Prenatal labs: ABO, Rh: A/Positive/-- (03/12 0000) Antibody: Negative (03/12 0000) Rubella: Immune (03/12 0000) RPR: Nonreactive (03/12 0000)  HBsAg: Negative (03/12 0000)  HIV: Non-reactive (03/12 0000)  GBS: Negative (07/05 0000)   Assessment/Plan: Postdates.  2 stage IOL.   Keland Peyton A 07/12/2012, 8:33 AM

## 2012-07-12 NOTE — Anesthesia Preprocedure Evaluation (Signed)

## 2012-07-12 NOTE — Progress Notes (Signed)
Destiny Horn is a 21 y.o. G2P1001 at [redacted]w[redacted]d by LMP admitted for induction of labor due to Post dates. Due date 07-07-12.  Subjective:   Objective: BP 131/61  Pulse 104  Temp 97.9 F (36.6 C) (Oral)  Resp 20  Ht 5' 3.5" (1.613 m)  Wt 71.668 kg (158 lb)  BMI 27.55 kg/m2  SpO2 97%  LMP 10/01/2011      FHT:  FHR: 150 bpm, variability: moderate,  accelerations:  Present,  decelerations:  Absent UC:   regular, every 2-3 minutes SVE:   Dilation: Lip/rim Effacement (%): 100 Station: +1;+2 Exam by:: Penley, RN   Labs: Lab Results  Component Value Date   WBC 5.6 07/12/2012   HGB 9.7* 07/12/2012   HCT 30.6* 07/12/2012   MCV 73.7* 07/12/2012   PLT 290 07/12/2012    Assessment / Plan: Induction of labor due to postterm,  progressing well on pitocin  Labor: Progressing normally Preeclampsia:  n/a Fetal Wellbeing:  Category I Pain Control:  Epidural I/D:  n/a Anticipated MOD:  NSVD  Destiny Horn A 07/12/2012, 8:08 PM

## 2012-07-12 NOTE — Anesthesia Procedure Notes (Signed)
Epidural Patient location during procedure: OB Start time: 07/12/2012 6:12 PM  Staffing Anesthesiologist: Melanie Openshaw A. Performed by: anesthesiologist   Preanesthetic Checklist Completed: patient identified, site marked, surgical consent, pre-op evaluation, timeout performed, IV checked, risks and benefits discussed and monitors and equipment checked  Epidural Patient position: sitting Prep: site prepped and draped and DuraPrep Patient monitoring: continuous pulse ox and blood pressure Approach: midline Injection technique: LOR air  Needle:  Needle type: Tuohy  Needle gauge: 17 G Needle length: 9 cm Needle insertion depth: 4 cm Catheter type: closed end flexible Catheter size: 19 Gauge Catheter at skin depth: 9 cm Test dose: negative and Other  Assessment Events: blood not aspirated, injection not painful, no injection resistance, negative IV test and no paresthesia  Additional Notes Patient identified. Risks and benefits discussed including failed block, incomplete  Pain control, post dural puncture headache, nerve damage, paralysis, blood pressure Changes, nausea, vomiting, reactions to medications-both toxic and allergic and post Partum back pain. All questions were answered. Patient expressed understanding and wished to proceed. Sterile technique was used throughout procedure. Epidural site was Dressed with sterile barrier dressing. No paresthesias, signs of intravascular injection Or signs of intrathecal spread were encountered.  Patient was more comfortable after the epidural was dosed. Please see RN's note for documentation of vital signs and FHR which are stable.

## 2012-07-13 LAB — CBC
HCT: 30.5 % — ABNORMAL LOW (ref 36.0–46.0)
Hemoglobin: 9.6 g/dL — ABNORMAL LOW (ref 12.0–15.0)
MCV: 73.5 fL — ABNORMAL LOW (ref 78.0–100.0)
Platelets: 245 10*3/uL (ref 150–400)
RBC: 4.15 MIL/uL (ref 3.87–5.11)
WBC: 8.7 10*3/uL (ref 4.0–10.5)

## 2012-07-13 NOTE — Progress Notes (Signed)
UR chart review completed.  

## 2012-07-13 NOTE — Anesthesia Postprocedure Evaluation (Signed)
Anesthesia Post Note  Patient: Destiny Horn  Procedure(s) Performed: * No procedures listed *  Anesthesia type: Epidural  Patient location: Mother/Baby  Post pain: Pain level controlled  Post assessment: Post-op Vital signs reviewed  Last Vitals:  Filed Vitals:   07/13/12 1422  BP: 135/80  Pulse: 87  Temp: 36.6 C  Resp: 18    Post vital signs: Reviewed  Level of consciousness:alert  Complications: No apparent anesthesia complications

## 2012-07-13 NOTE — Progress Notes (Signed)
Post Partum Day 1 Subjective: no complaints  Objective: Blood pressure 124/60, pulse 80, temperature 97.7 F (36.5 C), temperature source Oral, resp. rate 18, height 5' 3.5" (1.613 m), weight 71.668 kg (158 lb), last menstrual period 10/01/2011, SpO2 97.00%, unknown if currently breastfeeding.  Physical Exam:  General: alert and no distress Lochia: appropriate Uterine Fundus: firm Incision: healing well DVT Evaluation: No evidence of DVT seen on physical exam.   Basename 07/13/12 0545 07/12/12 0745  HGB 9.6* 9.7*  HCT 30.5* 30.6*    Assessment/Plan: Plan for discharge tomorrow   LOS: 1 day   HARPER,CHARLES A 07/13/2012, 9:15 AM

## 2012-07-14 MED ORDER — OXYCODONE-ACETAMINOPHEN 5-325 MG PO TABS: 1.0000 | ORAL_TABLET | ORAL | Status: AC | PRN

## 2012-07-14 MED ORDER — IBUPROFEN 600 MG PO TABS: 600.0000 mg | ORAL_TABLET | Freq: Four times a day (QID) | ORAL | Status: DC

## 2012-07-14 NOTE — Progress Notes (Signed)
Post Partum Day 2 Subjective: no complaints  Objective: Blood pressure 120/64, pulse 82, temperature 97.7 F (36.5 C), temperature source Oral, resp. rate 18, height 5' 3.5" (1.613 m), weight 71.668 kg (158 lb), last menstrual period 10/01/2011, SpO2 97.00%, unknown if currently breastfeeding.  Physical Exam:  General: alert and no distress Lochia: appropriate Uterine Fundus: firm Incision: healing well DVT Evaluation: No evidence of DVT seen on physical exam.   Basename 07/13/12 0545 07/12/12 0745  HGB 9.6* 9.7*  HCT 30.5* 30.6*    Assessment/Plan: Discharge home   LOS: 2 days   HARPER,CHARLES A 07/14/2012, 8:40 AM

## 2012-07-14 NOTE — Progress Notes (Signed)
SW received consult for Hx of Developmental Delay Disorder.  SW reviewed PNR and does not note any such dx.  SW called bedside RN who states MOB has been appropriate and no concerns have been noted.  She too stated that no one could find this dx documented in MOB's medical record.  She states FOB is present and involved.  SW does note that this is not her first child.  SW contacted [facility] to ensure no current concerns.  They confirmed that they do not have an open case for any reason.  Therefore, SW has screened out referral, but asked bedside RN to contact SW if concerns arise or if MOB requests consult.

## 2012-07-14 NOTE — Discharge Summary (Signed)
Obstetric Discharge Summary Reason for Admission: onset of labor Prenatal Procedures: ultrasound Intrapartum Procedures: spontaneous vaginal delivery Postpartum Procedures: none Complications-Operative and Postpartum: none Hemoglobin  Date Value Range Status  07/13/2012 9.6* 12.0 - 15.0 g/dL Final     HCT  Date Value Range Status  07/13/2012 30.5* 36.0 - 46.0 % Final    Physical Exam:  General: alert and no distress Lochia: appropriate Uterine Fundus: firm Incision: healing well DVT Evaluation: No evidence of DVT seen on physical exam.  Discharge Diagnoses: Term Pregnancy-delivered  Discharge Information: Date: 07/14/2012 Activity: pelvic rest Diet: routine Medications: PNV, Ibuprofen, Colace and Percocet Condition: stable Instructions: refer to practice specific booklet Discharge to: home Follow-up Information    Follow up with HARPER,CHARLES A, MD. Schedule an appointment as soon as possible for a visit in 6 weeks.   Contact information:   [address] [place] [address] [phone]          Newborn Data: Live born female  Birth Weight: 6 lb 14.9 oz (3144 g) APGAR: 9, 9  Home with mother.  HARPER,CHARLES A 07/14/2012, 8:46 AM

## 2012-12-27 ENCOUNTER — Emergency Department (HOSPITAL_COMMUNITY): Admission: EM | Admit: 2012-12-27 | Discharge: 2012-12-27 | Discharge status: Absent without leave | Payer: Self-pay

## 2014-01-01 ENCOUNTER — Emergency Department (HOSPITAL_COMMUNITY)
Admission: EM | Admit: 2014-01-01 | Discharge: 2014-01-01 | Disposition: A | Discharge status: Home / Self Care | Payer: Medicaid Other | Attending: Emergency Medicine | Admitting: Emergency Medicine

## 2014-01-01 ENCOUNTER — Encounter (HOSPITAL_COMMUNITY): Payer: Self-pay | Admitting: Emergency Medicine

## 2014-01-01 DIAGNOSIS — R000 Tachycardia, unspecified: Secondary | ICD-10-CM | POA: Insufficient documentation

## 2014-01-01 DIAGNOSIS — R51 Headache: Secondary | ICD-10-CM | POA: Insufficient documentation

## 2014-01-01 DIAGNOSIS — Z79899 Other long term (current) drug therapy: Secondary | ICD-10-CM | POA: Insufficient documentation

## 2014-01-01 DIAGNOSIS — F411 Generalized anxiety disorder: Secondary | ICD-10-CM | POA: Insufficient documentation

## 2014-01-01 DIAGNOSIS — R519 Headache, unspecified: Secondary | ICD-10-CM

## 2014-01-01 MED ORDER — METHOCARBAMOL 500 MG PO TABS: 500.0000 mg | ORAL_TABLET | Freq: Two times a day (BID) | ORAL | Status: DC

## 2014-01-01 NOTE — ED Provider Notes (Signed)
CSN: [id number]     Arrival date & time 01/01/14  1502 History   First MD Initiated Contact with Patient 01/01/14 [phone]     Chief Complaint  Patient presents with  . Fatigue   (Consider location/radiation/quality/duration/timing/severity/associated sxs/prior Treatment) HPI  23 year old female presents for evaluation of head discomfort.  Pt report for the past 3 months she has been experiencing intermittent muscle spasm affecting her L forehead.  Report "somthing is jumping in my skin" lasting for seconds but recurrent.  Nothing makes it better or worse, it is increasing in frequency which concerns her.  No report of fever, chills, vision changes, light/sound sensitivity, ear pain, runny nose, sore throat, rash, numbness, weakness, speech changes, any trauma.  Does not have any PCP, is here to seek for help.  Denies headache.  No specific treatment tried.    Past Medical History  Diagnosis Date  . No pertinent past medical history    Past Surgical History  Procedure Laterality Date  . No past surgeries     Family History  Problem Relation Age of Onset  . Anesthesia problems Neg Hx   . Hypotension Neg Hx   . Malignant hyperthermia Neg Hx   . Pseudochol deficiency Neg Hx    History  Substance Use Topics  . Smoking status: Never Smoker   . Smokeless tobacco: Never Used  . Alcohol Use: No   OB History   Grav Para Term Preterm Abortions TAB SAB Ect Mult Living   2 2 2  0 0 0 0 0 0 2     Review of Systems  Constitutional: Negative for fever.  Skin: Negative for rash.  Neurological: Negative for numbness and headaches.  All other systems reviewed and are negative.    Allergies  Review of patient's allergies indicates no known allergies.  Home Medications   Current Outpatient Rx  Name  Route  Sig  Dispense  Refill  . EXPIRED: ibuprofen (ADVIL,MOTRIN) 600 MG tablet   Oral   Take 1 tablet (600 mg total) by mouth every 6 (six) hours.   30 tablet   5   . Prenatal Vit-Fe  Fumarate-FA (PRENATAL MULTIVITAMIN) TABS   Oral   Take 1 tablet by mouth 2 (two) times daily.          BP 128/65  Pulse 101  Temp(Src) 98.2 F (36.8 C) (Oral)  Resp 20  Wt 136 lb (61.689 kg)  SpO2 96% Physical Exam  Nursing note and vitals reviewed. Constitutional: She is oriented to person, place, and time. She appears well-developed and well-nourished. No distress.  HENT:  Head: Atraumatic.  Right Ear: External ear normal.  Left Ear: External ear normal.  Nose: Nose normal.  Mouth/Throat: Oropharynx is clear and moist. No oropharyngeal exudate.  Eyes: Conjunctivae and EOM are normal. Pupils are equal, round, and reactive to light.  Neck: Normal range of motion. Neck supple.  Cardiovascular:  Mild tachycardia without M/R/G  Pulmonary/Chest: Effort normal and breath sounds normal.  Abdominal: Soft.  Lymphadenopathy:    She has no cervical adenopathy.  Neurological: She is alert and oriented to person, place, and time. She displays no tremor and normal reflexes. No cranial nerve deficit or sensory deficit. She exhibits normal muscle tone. She displays no seizure activity. Coordination normal. GCS eye subscore is 4. GCS verbal subscore is 5. GCS motor subscore is 6.  Skin: No rash noted.  L forehead with normal skin, no obvious muscle spasm, nontender on palpation.  Psychiatric: She has a normal mood and affect.  Appears anxious    ED Course  Procedures (including critical care time)  4:19 PM Pt report intermittent muscle spasm affecting L forehead.  No gross focal neuro deficit, no rash, no red flags.  Doubt SAH, STroke, Meningitis, MS, infectious etiology. Do not think advance imaging is indicated at this time.   Pt likely benefit from PCP evaluation, resources given.  Muscle relaxant prescribed.  Return precaution including fever, neck stiffness, rash, numbness.  Pt voice understanding.    Labs Review Labs Reviewed - No data to display Imaging Review No results  found.  EKG Interpretation   None       MDM   1. Forehead pain    BP 128/65  Pulse 101  Temp(Src) 98.2 F (36.8 C) (Oral)  Resp 20  Wt 136 lb (61.689 kg)  SpO2 96%     Fayrene HelperBowie Peg Fifer, PA-C 01/01/14 1634

## 2014-01-01 NOTE — ED Notes (Signed)
To ED for eval of increased fatigue and feels like she has something 'jumping on me' while pointing to a particular spot of forehead. States it happens intermittently, but more when laughing. No injury noted to forehead. States this has been happening for 3 months.

## 2014-01-01 NOTE — ED Provider Notes (Signed)
Medical screening examination/treatment/procedure(s) were performed by non-physician practitioner and as supervising physician I was immediately available for consultation/collaboration.  EKG Interpretation   None         Destiny B. Bernette MayersSheldon, MD 01/01/14 [phone]

## 2014-01-01 NOTE — Discharge Instructions (Signed)
Please follow up with a primary care provider for further evaluation of your forehead discomfort.  Return if you develop fever, numbness, difficulty speaking, rash, neck stiffness or if you have other concerns.     Emergency Department Resource Guide 1) Find a Doctor and Pay Out of Pocket Although you won't have to find out who is covered by your insurance plan, it is a good idea to ask around and get recommendations. You will then need to call the office and see if the doctor you have chosen will accept you as a new patient and what types of options they offer for patients who are self-pay. Some doctors offer discounts or will set up payment plans for their patients who do not have insurance, but you will need to ask so you aren't surprised when you get to your appointment.  2) Contact Your Local Health Department Not all health departments have doctors that can see patients for sick visits, but many do, so it is worth a call to see if yours does. If you don't know where your local health department is, you can check in your phone book. The CDC also has a tool to help you locate your state's health department, and many state websites also have listings of all of their local health departments.  3) Find a Walk-in Clinic If your illness is not likely to be very severe or complicated, you may want to try a walk in clinic. These are popping up all over the country in pharmacies, drugstores, and shopping centers. They're usually staffed by nurse practitioners or physician assistants that have been trained to treat common illnesses and complaints. They're usually fairly quick and inexpensive. However, if you have serious medical issues or chronic medical problems, these are probably not your best option.  No Primary Care Doctor: - Call Health Connect at  [phone] - they can help you locate a primary care doctor that  accepts your insurance, provides certain services, etc. - Physician Referral Service-  [phone]  Chronic Pain Problems: Organization         Address  Phone   Notes  [facility]  [phone] Patients need to be referred by their primary care doctor.   Medication Assistance: Organization         Address  Phone   Notes  [facility] [address] [place], [place] [address] [phone] --Must be a resident of [facility] -- Must have NO insurance coverage whatsoever (no Medicaid/ Medicare, etc.) -- The pt. MUST have a primary care doctor that directs their care regularly and follows them in the community   MedAssist  [phone]   [employer]  [phone]    Agencies that provide inexpensive medical care: Organization         Address  Phone   Notes  [facility]  [phone]   [facility]    [phone]   [facility] [address] [place], [place] [address] [phone]   [facility] [address], [place] [phone]   [facility]anned Parenthood    [phone]   [facility]    [phone]   [facility]  [address], [place] Phone:  [phone], Fax:  [phone] Hours of Operation:  9 am - 6 pm, M-F.  Also accepts Medicaid/Medicare and self-pay.  [facility]  [address], [place] Phone: [phone], Fax: [phone]. Hours of Operation:  8:30 am - 5:30 pm, M-F.  Also accepts Medicaid and self-pay.  [facility] [place] [address], [place] Phone: [phone]   [place], [place], [place] [phone], Ext. [phone] Mondays & Thursdays: 7-9 AM.  First 15 patients are seen on a first come, first serve basis.    [place] Providers:  Organization         Address  Phone   Notes  [facility] [address],  [place] [phone] Also accepts self-pay patients.  [facility] [address], [place]  [phone]   [place], [address], [place] [phone]   [facility] [address] [place] [phone]   Lucianne Lei [address], [place]   [phone] Only accepts [place] Access [place] patients after they have their name applied to their card.   Self-Pay (no insurance) in [facility]:  Organization         Address  Phone   Notes  Sickle Cell Patients, [facility] [place] [phone]   [facility] [place] [phone]   [facility] [place]  [place], [place], [place] [phone]   [facility]/Dr. Osei-Bonsu  [address], [place] or [address], [place] [phone] Phone number for both [place] and [place] locations is the same.  [facility] [address], [place] [phone]   [facility] [address] [place] or [address] [phone]   [facility] [address], [place] [phone], phone; [phone], fax Sees patients 1st and 3rd Saturday of every month.  Must not qualify for public or private insurance (i.e. Medicaid, Medicare, [place] Health Choice, Veterans' Benefits)  Household income should be no more than 200% of the poverty level The clinic cannot treat you if you are pregnant or think you are pregnant  Sexually transmitted diseases are not treated at the clinic.    Dental Care: Organization         Address  Phone  Notes  [facility] Department of [facility] [phone] Accepts children up to age 29 who are enrolled in  [place] or [place]; pregnant women with a Medicaid card; and children who have applied for Medicaid or [place] Health Choice, but were declined, whose parents can pay a reduced fee at time of service.  [facility] of [facility]  [address], [place] [phone] Accepts children up to age 36 who are enrolled in [place] or [place]; pregnant women with a Medicaid card; and children who have applied for Medicaid or [place] Health Choice, but were declined, whose parents can pay a reduced fee at time of service.  [facility] Access PRO[facility]  [place] [phone] Patients are seen by appointment only. Walk-ins are not accepted. [place] will see patients 66 years of age and older. Monday - Tuesday (8am-5pm) Most Wednesdays (8:30-5pm) $30 per visit, cash only  [facility]  [address], [facility] [phone] Patients are seen by appointment only. Walk-ins are not accepted. [place] will see patients 55 years of age and older. One Wednesday Evening (Monthly: Volunteer Based).  $30 per visit, cash only  [place]  [phone] for adults; Children under age 16, call Graduate Pediatric Dentistry at [phone]. Children aged 15-14, please call [phone] to request a pediatric application.  Dental services are provided in all areas of dental care including fillings, crowns and bridges, complete and partial dentures, implants, gum treatment, root canals, and extractions. Preventive care is also provided. Treatment is provided to both adults and children. Patients are selected via a lottery and there is often a waiting list.   [facility] [address] [place]  [phone] [email or web address]   [facility] [address] [place], [place] [phone], Ext. [phone] Second and Fourth Thursday of each month, opens at 6:30  AM; Clinic ends at 9 AM.  Patients are seen on a first-come first-served basis, and a limited number are seen during each clinic.   [facility]  [address] Hillard Danker [place], [place] [phone]   Eligibility Requirements You must have lived in [place], [place], or [place] counties for at least the last three months.   You cannot be eligible for state or federal sponsored [employer], including [employer], [place], or [employer].   You generally cannot be eligible for healthcare insurance through your employer.    How to apply: Eligibility screenings are held every Tuesday and Wednesday afternoon from 1:00 pm until 4:00 pm. You do not need an appointment for the interview!  [facility] [address] [place], [place]   [employer]  [place] Department  [place]  [phone]    Behavioral Health Resources in the Community: Intensive Outpatient Programs Organization         Address  Phone  Notes  [place]. [address], [place], [place] [phone]   [facility] [address], [place], [employer]   ADS: Alcohol & Drug Svcs [address], [place], [place]   [place] [address]  [place], [place] or [phone]   Substance Abuse Resources Organization         Address  Phone  Notes  Alcohol and Drug Services  [phone]   [place]  [phone]   [facility] [place]   Chinita Pester  [phone]   Residential & Outpatient Substance Abuse Program  [phone]   Psychological Services Organization         Address  Phone  Notes  [facility] [place]  [place]  [phone]   [place] [address] [place] [phone] or  [phone]    Mobile Crisis Teams Organization         Address  Phone  Notes  [facility], Mobile Crisis Care Unit  [phone]   [facility]  [address] [place], [place]   [place] [address] [place] [phone]    Self-Help/Support Groups Organization         Address  Phone             Notes  [facility] of [place] - variety of support groups  [place] Call for more information  Narcotics Anonymous (NA), [facility] [address], [employer] [place]  2 meetings at this location   Special educational needs teacher         Address  Phone  Notes  [facility] [place],    [place]  [phone]   [facility]  [address], [place] [address], [place], [place]   [place], [place] [phone] Admissions: 8am-3pm M-F  Incentives Substance [place] [address],    [place], [place] [phone]   [facility] [address] [place], [place], [place]   [facility] [address],  [place], [place]   [facility] - Intensive Outpatient [address], [place], [place]   [facility] ([place] [address].,  [place], [place] [phone] or [phone]   [facility] (RTS) [address] [place], [place] Accepts Medicaid  [facility] [place] [address]  [place] [phone] Substance Abuse/Addiction Treatment   [facility] Organization         Address  Phone  Notes  [facility]  [phone]   Domenic Schwab, PhD [address] Arlis Porta [place], [place]   [phone] or [phone]   [place], [place] [phone]   [facility] [address],  [place], [place] [phone] Insurance/Medicaid/sponsorship through [facility] and Families [address], [address]                                    [place], [place] [phone] [place] [address], [place] [phone]    Dr. Adele Schilder  [phone]   [facility] Dept. 1) [address] [place] 2) [place] 3)  [place] [address], [place] [phone]  [phone]   [place] [phone] or [phone] (After Hours)

## 2014-03-06 ENCOUNTER — Emergency Department (HOSPITAL_COMMUNITY)
Admission: EM | Admit: 2014-03-06 | Discharge: 2014-03-06 | Disposition: A | Discharge status: Home / Self Care | Payer: Medicaid Other | Source: Home / Self Care | Attending: Family Medicine | Admitting: Family Medicine

## 2014-03-06 ENCOUNTER — Encounter (HOSPITAL_COMMUNITY): Payer: Self-pay | Admitting: Emergency Medicine

## 2014-03-06 DIAGNOSIS — H8110 Benign paroxysmal vertigo, unspecified ear: Secondary | ICD-10-CM

## 2014-03-06 LAB — POCT PREGNANCY, URINE: Preg Test, Ur: NEGATIVE

## 2014-03-06 LAB — POCT URINALYSIS DIP (DEVICE)
BILIRUBIN URINE: NEGATIVE
Glucose, UA: NEGATIVE mg/dL
KETONES UR: NEGATIVE mg/dL
Leukocytes, UA: NEGATIVE
Nitrite: NEGATIVE
PH: 6.5 (ref 5.0–8.0)
PROTEIN: NEGATIVE mg/dL
Specific Gravity, Urine: <=1.005 (ref 1.005–1.030)
Urobilinogen, UA: 0.2 mg/dL (ref 0.0–1.0)

## 2014-03-06 MED ORDER — TRIAMCINOLONE ACETONIDE 40 MG/ML IJ SUSP
40.0000 mg | Freq: Once | INTRAMUSCULAR | Status: AC
  Administered 2014-03-06: 40 mg via INTRAMUSCULAR

## 2014-03-06 MED ORDER — TRIAMCINOLONE ACETONIDE 40 MG/ML IJ SUSP
INTRAMUSCULAR | Status: AC
  Filled 2014-03-06: qty 1

## 2014-03-06 MED ORDER — MECLIZINE HCL 50 MG PO TABS: 50.0000 mg | ORAL_TABLET | Freq: Three times a day (TID) | ORAL | Status: DC | PRN

## 2014-03-06 MED ORDER — IPRATROPIUM BROMIDE 0.06 % NA SOLN: 2.0000 | Freq: Four times a day (QID) | NASAL | Status: DC

## 2014-03-06 NOTE — ED Notes (Signed)
No menses x 3 years (implanon) c/o HA, dizzy . Has not taken any OTC medication of rher HA, had no meds at home

## 2014-03-06 NOTE — ED Provider Notes (Signed)
CSN: [id number]     Arrival date & time 03/06/14  1809 History   First MD Initiated Contact with Patient 03/06/14 1903     Chief Complaint  Patient presents with  . Headache   (Consider location/radiation/quality/duration/timing/severity/associated sxs/prior Treatment) Patient is a 23 y.o. female presenting with dizziness. The history is provided by the patient.  Dizziness Quality:  Imbalance Severity:  Mild Onset quality:  Sudden Duration:  12 hours Progression:  Unchanged Chronicity:  New Context: bending over and head movement   Context: not with loss of consciousness   Relieved by:  Being still Worsened by:  Turning head and movement Associated symptoms: headaches   Associated symptoms: no nausea and no vomiting     Past Medical History  Diagnosis Date  . No pertinent past medical history    Past Surgical History  Procedure Laterality Date  . No past surgeries     Family History  Problem Relation Age of Onset  . Anesthesia problems Neg Hx   . Hypotension Neg Hx   . Malignant hyperthermia Neg Hx   . Pseudochol deficiency Neg Hx    History  Substance Use Topics  . Smoking status: Never Smoker   . Smokeless tobacco: Never Used  . Alcohol Use: No   OB History   Grav Para Term Preterm Abortions TAB SAB Ect Mult Living   2 2 2  0 0 0 0 0 0 2     Review of Systems  Constitutional: Negative.   HENT: Positive for congestion, postnasal drip and rhinorrhea.   Respiratory: Negative.   Cardiovascular: Negative.   Gastrointestinal: Negative.  Negative for nausea and vomiting.  Genitourinary: Negative.   Neurological: Positive for dizziness and headaches.    Allergies  Review of patient's allergies indicates no known allergies.  Home Medications   Current Outpatient Rx  Name  Route  Sig  Dispense  Refill  . EXPIRED: ibuprofen (ADVIL,MOTRIN) 600 MG tablet   Oral   Take 1 tablet (600 mg total) by mouth every 6 (six) hours.   30 tablet   5   . ipratropium  (ATROVENT) 0.06 % nasal spray   Each Nare   Place 2 sprays into both nostrils 4 (four) times daily.   15 mL   1   . meclizine (ANTIVERT) 50 MG tablet   Oral   Take 1 tablet (50 mg total) by mouth 3 (three) times daily as needed for dizziness.   20 tablet   0   . methocarbamol (ROBAXIN) 500 MG tablet   Oral   Take 1 tablet (500 mg total) by mouth 2 (two) times daily.   20 tablet   0   . Prenatal Vit-Fe Fumarate-FA (PRENATAL MULTIVITAMIN) TABS   Oral   Take 1 tablet by mouth 2 (two) times daily.          BP 116/75  Pulse 111  Temp(Src) 98 F (36.7 C) (Oral)  Resp 20  SpO2 99% Physical Exam  Nursing note and vitals reviewed. Constitutional: She is oriented to person, place, and time. She appears well-developed and well-nourished.  HENT:  Head: Normocephalic.  Right Ear: External ear normal.  Left Ear: External ear normal.  Mouth/Throat: Oropharynx is clear and moist.  Eyes: Conjunctivae and EOM are normal. Pupils are equal, round, and reactive to light.  Neck: Normal range of motion. Neck supple.  Cardiovascular: Regular rhythm and normal heart sounds.   Pulmonary/Chest: Breath sounds normal.  Lymphadenopathy:    She has no cervical  adenopathy.  Neurological: She is alert and oriented to person, place, and time. No cranial nerve deficit. Coordination normal.  Skin: Skin is warm and dry.    ED Course  Procedures (including critical care time) Labs Review Labs Reviewed  POCT URINALYSIS DIP (DEVICE) - Abnormal; Notable for the following:    Hgb urine dipstick MODERATE (*)    All other components within normal limits  POCT PREGNANCY, URINE   Imaging Review No results found.   MDM   1. BPV (benign positional vertigo)        Linna HoffJames D America Sandall, MD 03/06/14 [phone]

## 2014-03-06 NOTE — Discharge Instructions (Signed)
Use medicine and drink plenty of water

## 2014-03-08 ENCOUNTER — Ambulatory Visit (INDEPENDENT_AMBULATORY_CARE_PROVIDER_SITE_OTHER): Payer: Medicaid Other | Admitting: Obstetrics

## 2014-03-08 DIAGNOSIS — R42 Dizziness and giddiness: Secondary | ICD-10-CM

## 2014-03-08 DIAGNOSIS — Z3046 Encounter for surveillance of implantable subdermal contraceptive: Secondary | ICD-10-CM

## 2014-03-08 NOTE — Progress Notes (Signed)
Subjective:     Destiny StarchDeyfilia G Horn is a 23 y.o. female here for a routine exam.  Current complaints: pt states that [facility].  Pt states that they treated her for positional vertigo.  Pt states she was informed to make appt with OB/GYN as soon as possible for Nexplanon check.  Pt states that she has had her Nexplanon in place for 2 years with no problems.     The HPI was reviewed and explored in further detail by the provider. Gynecologic History No LMP recorded. Contraception: Nexplanon   Obstetric History OB History  Gravida Para Term Preterm AB SAB TAB Ectopic Multiple Living  2 2 2  0 0 0 0 0 0 2    # Outcome Date GA Lbr Len/2nd Weight Sex Delivery Anes PTL Lv  2 TRM 07/12/12 147w5d 03:06 / 00:12 6 lb 14.9 oz (3.144 kg) F SVD EPI  Y     Comments: WNL  1 TRM 2011 5480w0d 48:00 7 lb 6 oz (3.345 kg) M SVD EPI  Y       The following portions of the patient's history were reviewed and updated as appropriate: allergies, current medications, past family history, past medical history, past social history, past surgical history and problem list.  Review of Systems A comprehensive review of systems was negative except for: Neurological: positive for dizziness    Objective:    General appearance: alert and no distress Head: Normocephalic, without obvious abnormality, atraumatic Eyes: conjunctivae/corneas clear. PERRL, EOM's intact. Fundi benign. Extremities: extremities normal, atraumatic, no cyanosis or edema and Nexplanon palpated, intact. Pulses: 2+ and symmetric      Assessment:    Healthy female exam.   Nexplanon Surveillance   Postural dizziness.  Not related to Nexplanon.   Plan:    Discussed normal side effects of Nexplanon along with other causes of dizziness.  Will follow clinically.

## 2014-03-20 ENCOUNTER — Encounter: Payer: Self-pay | Admitting: Obstetrics

## 2014-03-20 DIAGNOSIS — R42 Dizziness and giddiness: Secondary | ICD-10-CM | POA: Insufficient documentation

## 2014-04-06 ENCOUNTER — Emergency Department (HOSPITAL_COMMUNITY)
Admission: EM | Admit: 2014-04-06 | Discharge: 2014-04-06 | Disposition: A | Discharge status: Home / Self Care | Payer: Medicaid Other | Source: Home / Self Care | Attending: Family Medicine | Admitting: Family Medicine

## 2014-04-06 ENCOUNTER — Encounter (HOSPITAL_COMMUNITY): Payer: Self-pay | Admitting: Emergency Medicine

## 2014-04-06 DIAGNOSIS — L0100 Impetigo, unspecified: Secondary | ICD-10-CM

## 2014-04-06 MED ORDER — MUPIROCIN CALCIUM 2 % EX CREA: 1.0000 "application " | TOPICAL_CREAM | Freq: Three times a day (TID) | CUTANEOUS | Status: DC

## 2014-04-06 NOTE — Discharge Instructions (Signed)
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  pay close attention to purchasing nickle free earrings.  Impetigo Impetigo is an infection of the skin, most common in babies and children.  CAUSES  It is caused by staphylococcal or streptococcal germs (bacteria). Impetigo can start after any damage to the skin. The damage to the skin may be from things like:   Chickenpox.  Scrapes.  Scratches.  Insect bites (common when children scratch the bite).  Cuts.  Nail biting or chewing. Impetigo is contagious. It can be spread from one person to another. Avoid close skin contact, or sharing towels or clothing. SYMPTOMS  Impetigo usually starts out as small blisters or pustules. Then they turn into tiny yellow-crusted sores (lesions).  There may also be:  Large blisters.  Itching or pain.  Pus.  Swollen lymph glands. With scratching, irritation, or non-treatment, these small areas may get larger. Scratching can cause the germs to get under the fingernails; then scratching another part of the skin can cause the infection to be spread there. DIAGNOSIS  Diagnosis of impetigo is usually made by a physical exam. A skin culture (test to grow bacteria) may be done to prove the diagnosis or to help decide the best treatment.  TREATMENT  Mild impetigo can be treated with prescription antibiotic cream. Oral antibiotic medicine may be used in more severe cases. Medicines for itching may be used. HOME CARE INSTRUCTIONS   To avoid spreading impetigo to other body areas:  Keep fingernails short and clean.  Avoid scratching.  Cover infected areas if necessary to keep from scratching.  Gently wash the infected areas with antibiotic soap and water.  Soak crusted areas in warm soapy water using antibiotic soap.  Gently rub the areas to remove crusts. Do not scrub.  Wash hands often to avoid spread this infection.  Keep children with impetigo home from school  or daycare until they have used an antibiotic cream for 48 hours (2 days) or oral antibiotic medicine for 24 hours (1 day), and their skin shows significant improvement.  Children may attend school or daycare if they only have a few sores and if the sores can be covered by a bandage or clothing. SEEK MEDICAL CARE IF:   More blisters or sores show up despite treatment.  Other family members get sores.  Rash is not improving after 48 hours (2 days) of treatment. SEEK IMMEDIATE MEDICAL CARE IF:   You see spreading redness or swelling of the skin around the sores.  You see red streaks coming from the sores.  Your child develops a fever of 100.4 F (37.2 C) or higher.  Your child develops a sore throat.  Your child is acting ill (lethargic, sick to their stomach). Document Released: 11/14/2000 Document Revised: 02/09/2012 Document Reviewed: 09/13/2008 [facility] Patient Information 2014 [employer] [place].

## 2014-04-06 NOTE — ED Notes (Signed)
Pt  Reports         Symptoms  Of  Bilateral   Earache           With  Crusty  Drainage         Since    Last  Week    denys  Any  Other  Symptoms        No  sorethroat                  In  No  Acute  Distress

## 2014-04-06 NOTE — ED Provider Notes (Signed)
CSN: [id number]     Arrival date & time 04/06/14  1024 History   First MD Initiated Contact with Patient 04/06/14 1129     Chief Complaint  Patient presents with  . Ear Fullness   (Consider location/radiation/quality/duration/timing/severity/associated sxs/prior Treatment) HPI Comments: Patient presents with one week history of rash to both ears. States she has had difficulty with on occasion since she was a teenager and feels it is related to wearing earrings. No fever/chills PCP: Dr. Sigmund Hazel. Aguilar Attempted to see PCP today but MD not in office.  The history is provided by the patient.    Past Medical History  Diagnosis Date  . No pertinent past medical history    Past Surgical History  Procedure Laterality Date  . No past surgeries     Family History  Problem Relation Age of Onset  . Anesthesia problems Neg Hx   . Hypotension Neg Hx   . Malignant hyperthermia Neg Hx   . Pseudochol deficiency Neg Hx    History  Substance Use Topics  . Smoking status: Never Smoker   . Smokeless tobacco: Never Used  . Alcohol Use: No   OB History   Grav Para Term Preterm Abortions TAB SAB Ect Mult Living   2 2 2  0 0 0 0 0 0 2     Review of Systems  All other systems reviewed and are negative.   Allergies  Review of patient's allergies indicates no known allergies.  Home Medications   Prior to Admission medications   Medication Sig Start Date End Date Taking? Authorizing Provider  ibuprofen (ADVIL,MOTRIN) 600 MG tablet Take 1 tablet (600 mg total) by mouth every 6 (six) hours. 07/14/12 07/24/12  Brock Badharles A Harper, MD  ipratropium (ATROVENT) 0.06 % nasal spray Place 2 sprays into both nostrils 4 (four) times daily. 03/06/14   Linna HoffJames D Kindl, MD  meclizine (ANTIVERT) 50 MG tablet Take 1 tablet (50 mg total) by mouth 3 (three) times daily as needed for dizziness. 03/06/14   Linna HoffJames D Kindl, MD  methocarbamol (ROBAXIN) 500 MG tablet Take 1 tablet (500 mg total) by mouth 2 (two) times daily. 01/01/14    Fayrene HelperBowie Tran, PA-C  Prenatal Vit-Fe Fumarate-FA (PRENATAL MULTIVITAMIN) TABS Take 1 tablet by mouth 2 (two) times daily.    Historical Provider, MD   BP 120/70  Pulse 72  Temp(Src) 98.6 F (37 C) (Oral)  Resp 18  SpO2 100% Physical Exam  Nursing note reviewed. Constitutional: She is oriented to person, place, and time. She appears well-developed and well-nourished.  HENT:  Head: Normocephalic and atraumatic.  Right Ear: Hearing, tympanic membrane, external ear and ear canal normal. No lacerations. No drainage, swelling or tenderness. No foreign bodies. No mastoid tenderness. Tympanic membrane is not injected. No middle ear effusion. No decreased hearing is noted.  Left Ear: Hearing, tympanic membrane and ear canal normal. No lacerations. There is tenderness. No swelling. No foreign bodies. No mastoid tenderness. Tympanic membrane is not injected.  No middle ear effusion.  Ears:  Nose: Nose normal.  Mouth/Throat: Uvula is midline, oropharynx is clear and moist and mucous membranes are normal. No oral lesions. No trismus in the jaw.  Area outline is with mild erythema and no induration or STS. Some weeping yellow/clear crusted drainage along helix  Eyes: Conjunctivae are normal.  Neck: Normal range of motion. Neck supple.  Cardiovascular: Normal rate.   Pulmonary/Chest: Effort normal.  Musculoskeletal: Normal range of motion.  Lymphadenopathy:    She has no  cervical adenopathy.  Neurological: She is alert and oriented to person, place, and time.  Skin: Skin is warm and dry.  Psychiatric: She has a normal mood and affect. Her behavior is normal.    ED Course  Procedures (including critical care time) Labs Review Labs Reviewed - No data to display  Imaging Review No results found.   MDM   1. Impetigo    Mild impetigo of left ear. Right ear normal. 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 She may want to pay  close attention to purchasing nickle free earrings.    Jess BartersJennifer Lee [place] Bottom[place], [place] 04/06/14 1155

## 2014-04-07 NOTE — ED Provider Notes (Signed)
Medical screening examination/treatment/procedure(s) were performed by a resident physician or non-physician practitioner and as the supervising physician I was immediately available for consultation/collaboration.  Evan Corey, MD    Evan S Corey, MD 04/07/14 0755 

## 2014-09-07 ENCOUNTER — Ambulatory Visit: Payer: Medicaid Other | Admitting: Obstetrics

## 2014-10-02 ENCOUNTER — Encounter (HOSPITAL_COMMUNITY): Payer: Self-pay | Admitting: Emergency Medicine

## 2015-08-21 ENCOUNTER — Telehealth: Payer: Self-pay | Admitting: *Deleted

## 2015-08-21 NOTE — Telephone Encounter (Signed)
Patient interested in a Nexplanon removal and insertion. Patient has been scheduled for 08-24-15 @ 11:00 am.

## 2015-08-24 ENCOUNTER — Ambulatory Visit: Payer: Medicaid Other | Admitting: Obstetrics

## 2015-09-13 ENCOUNTER — Ambulatory Visit (INDEPENDENT_AMBULATORY_CARE_PROVIDER_SITE_OTHER): Payer: Medicaid Other | Admitting: Obstetrics

## 2015-09-13 ENCOUNTER — Encounter: Payer: Self-pay | Admitting: Obstetrics

## 2015-09-13 VITALS — BP 105/66 | HR 76 | Temp 97.3°F | Wt 144.0 lb

## 2015-09-13 DIAGNOSIS — Z30017 Encounter for initial prescription of implantable subdermal contraceptive: Secondary | ICD-10-CM

## 2015-09-13 DIAGNOSIS — Z3049 Encounter for surveillance of other contraceptives: Secondary | ICD-10-CM | POA: Diagnosis not present

## 2015-09-13 DIAGNOSIS — Z3046 Encounter for surveillance of implantable subdermal contraceptive: Secondary | ICD-10-CM

## 2015-09-13 NOTE — Progress Notes (Addendum)
NEXPLANON REMOVAL NOTE  Date of LMP:   unknown  Contraception used: *Nexplanon   Indications:  The patient desires removal of Nexplanon and reinsertion of new Nexplanon..  She understands risks, benefits, and alternatives to Implanon and would like to proceed.  Anesthesia:   Lidocaine 1% plain.  Procedure:  A time-out was performed confirming the procedure and the patient's allergy status.  Complications: None                      The rod was palpated and the area was sterilely prepped.  The area beneath the distal tip was anesthetized with 1% xylocaine and the skin incised                       Over the tip and the tip was exposed, grasped with forcep and removed intact.  Suture of 4-0 Vicryl was used to close incision.  Steri strip                       And a bandage applied and the arm was wrapped with gauze bandage.  The patient tolerated well.  Instructions:  The patient was instructed to remove the dressing in 24 hours and that some bruising is to be expected.  She was advised to use over the counter analgesics as needed for any pain at the site.  She is to keep the area dry for 24 hours and to call if her hand or arm becomes cold, numb, or blue.   NEXPLANON INSERTION NOTE  Date of LMP:   Unknown  Contraception used: *Nexplanon  Pregnancy test result:  Lab Results  Component Value Date   PREGTESTUR NEGATIVE 03/06/2014    Indications:  The patient desires contraception.  She understands risks, benefits, and alternatives to Implanon and would like to proceed.  Anesthesia:   Lidocaine 1% plain.  Procedure:  A time-out was performed confirming the procedure and the patient's allergy status.  The patient's non-dominant was identified as the left arm.  The protection cap was removed. While placing countertraction on the skin, the needle was inserted at a 30 degree angle.  The applicator was held horizontal to the skin; the skin was tented upward as the needle was introduced into the  subdermal space.  While holding the applicator in place, the slider was unlocked. The Nexplanon was removed from the field.  The Nexplanon was palpated to ensure proper placement.  Complications: None  LOT#:  [id number] EXP:  02 / 2019  Instructions:  The patient was instructed to remove the dressing in 24 hours and that some bruising is to be expected.  She was advised to use over the counter analgesics as needed for any pain at the site.  She is to keep the area dry for 24 hours and to call if her hand or arm becomes cold, numb, or blue.  Return visit:  Return in 2 weeks

## 2015-09-27 ENCOUNTER — Encounter: Payer: Self-pay | Admitting: Obstetrics

## 2015-09-27 ENCOUNTER — Ambulatory Visit (INDEPENDENT_AMBULATORY_CARE_PROVIDER_SITE_OTHER): Payer: Medicaid Other | Admitting: Obstetrics

## 2015-09-27 VITALS — BP 109/63 | HR 87 | Temp 97.9°F | Wt 145.0 lb

## 2015-09-27 DIAGNOSIS — Z3046 Encounter for surveillance of implantable subdermal contraceptive: Secondary | ICD-10-CM | POA: Diagnosis not present

## 2015-09-27 DIAGNOSIS — Z3042 Encounter for surveillance of injectable contraceptive: Secondary | ICD-10-CM

## 2015-09-27 NOTE — Patient Instructions (Signed)
Pap Test WHY AM I HAVING THIS TEST? A pap test is sometimes called a pap smear. It is a screening test that is used to check for signs of cancer of the vagina, cervix, and uterus. The test can also identify the presence of infection or precancerous changes. Your health care provider will likely recommend you have this test done on a regular basis. This test may be done:  Every 3 years, starting at age 23.  Every 5 years, in combination with testing for the presence of human papillomavirus (HPV).  More or less often depending on other medical conditions.  WHAT KIND OF SAMPLE IS TAKEN? Using a small cotton swab, plastic spatula, or brush, your health care provider will collect a sample of cells from the surface of your cervix. Your cervix is the opening to your uterus, also called a womb. Secretions from the cervix and vagina may also be collected. HOW DO I PREPARE FOR THE TEST?  Be aware of where you are in your menstrual cycle. You may be asked to reschedule the test if you are menstruating on the day of the test.  You may need to reschedule if you have a known vaginal infection on the day of the test.  You may be asked to avoid douching or taking a bath the day before or the day of the test.  Some medicines can cause abnormal test results, such as digitalis and tetracycline. Talk with your health care provider before your test if you take one of these medicines. WHAT DO THE RESULTS MEAN? Abnormal test results may indicate a number of health conditions. These may include:  Cancer. Although pap test results cannot be used to diagnose cancer of the cervix, vagina, or uterus, they may suggest the possibility of cancer. Further tests would be required to determine if cancer is present.  Sexually transmitted disease.  Fungal infection.  Parasite infection.  Herpes infection.  A condition causing or contributing to infertility. It is your responsibility to obtain your test results. Ask  the lab or department performing the test when and how you will get your results. Contact your health care provider to discuss any questions you have about your results.   This information is not intended to replace advice given to you by your health care provider. Make sure you discuss any questions you have with your health care provider.   Document Released: 02/07/2003 Document Revised: 12/08/2014 Document Reviewed: 04/10/2014 Elsevier Interactive Patient Education 2016 [employer]  

## 2015-09-27 NOTE — Progress Notes (Signed)
Subjective:    Destiny Horn is a 24 y.o. female who presents for follow up after Nexplanon removal and reinsertion. The patient has no complaints today. The patient is sexually active. Pertinent past medical history: none.  The information documented in the HPI was reviewed and verified.  Menstrual History: OB History    Gravida Para Term Preterm AB TAB SAB Ectopic Multiple Living   2 2 2  0 0 0 0 0 0 2       No LMP recorded. Patient has had an implant.   Patient Active Problem List   Diagnosis Date Noted  . Postural dizziness 03/20/2014  . Developmental delay disorder 04/16/2012   Past Medical History  Diagnosis Date  . No pertinent past medical history     Past Surgical History  Procedure Laterality Date  . No past surgeries      No current outpatient prescriptions on file. No Known Allergies  Social History  Substance Use Topics  . Smoking status: Never Smoker   . Smokeless tobacco: Never Used  . Alcohol Use: No    Family History  Problem Relation Age of Onset  . Anesthesia problems Neg Hx   . Hypotension Neg Hx   . Malignant hyperthermia Neg Hx   . Pseudochol deficiency Neg Hx        Review of Systems Constitutional: negative for weight loss Genitourinary:negative for abnormal menstrual periods and vaginal discharge   Objective:   BP 109/63 mmHg  Pulse 87  Temp(Src) 97.9 F (36.6 C)  Wt 145 lb (65.772 kg)   PE:      Left arm:  Nexplanon removal site clean, dry, intact and non tender                       Nexplanon reinsertion site clean and rod palpated and was intact and nontender   Lab Review Urine pregnancy test Labs reviewed no Radiologic studies reviewed no    Assessment:    24 y.o., continuing Nexplanon, no contraindications.   Plan:    All questions answered. Contraception: Nexplanon. Agricultural engineerducational material distributed. Follow up in 2 months.  Annual and Pap  No orders of the defined types were placed in this encounter.   No orders of the defined types were placed in this encounter.

## 2015-12-06 ENCOUNTER — Ambulatory Visit: Payer: Medicaid Other | Admitting: Obstetrics

## 2015-12-10 ENCOUNTER — Telehealth: Payer: Self-pay | Admitting: Obstetrics

## 2015-12-10 NOTE — Telephone Encounter (Signed)
12/10/2015 - Patient's telephone voice mail not set up. Unable to reach to reschedule missed appts. brm

## 2015-12-25 NOTE — Telephone Encounter (Signed)
UNABLE TO REACH AFTER MULTIPLE ATTEMPTS.

## 2016-04-03 ENCOUNTER — Emergency Department (HOSPITAL_COMMUNITY)
Admission: EM | Admit: 2016-04-03 | Discharge: 2016-04-03 | Disposition: A | Discharge status: Home / Self Care | Payer: Medicaid Other | Attending: Emergency Medicine | Admitting: Emergency Medicine

## 2016-04-03 ENCOUNTER — Emergency Department (HOSPITAL_COMMUNITY): Payer: Medicaid Other

## 2016-04-03 ENCOUNTER — Encounter (HOSPITAL_COMMUNITY): Payer: Self-pay | Admitting: *Deleted

## 2016-04-03 DIAGNOSIS — S161XXA Strain of muscle, fascia and tendon at neck level, initial encounter: Secondary | ICD-10-CM | POA: Insufficient documentation

## 2016-04-03 DIAGNOSIS — Y990 Civilian activity done for income or pay: Secondary | ICD-10-CM | POA: Insufficient documentation

## 2016-04-03 DIAGNOSIS — W208XXA Other cause of strike by thrown, projected or falling object, initial encounter: Secondary | ICD-10-CM | POA: Diagnosis not present

## 2016-04-03 DIAGNOSIS — S060X0A Concussion without loss of consciousness, initial encounter: Secondary | ICD-10-CM | POA: Diagnosis not present

## 2016-04-03 DIAGNOSIS — Y9289 Other specified places as the place of occurrence of the external cause: Secondary | ICD-10-CM | POA: Insufficient documentation

## 2016-04-03 DIAGNOSIS — S0990XA Unspecified injury of head, initial encounter: Secondary | ICD-10-CM | POA: Diagnosis present

## 2016-04-03 DIAGNOSIS — Y9389 Activity, other specified: Secondary | ICD-10-CM | POA: Insufficient documentation

## 2016-04-03 MED ORDER — CYCLOBENZAPRINE HCL 10 MG PO TABS: 10.0000 mg | ORAL_TABLET | Freq: Two times a day (BID) | ORAL | Status: DC | PRN

## 2016-04-03 MED ORDER — NAPROXEN 500 MG PO TABS: 500.0000 mg | ORAL_TABLET | Freq: Two times a day (BID) | ORAL | Status: DC

## 2016-04-03 NOTE — Discharge Instructions (Signed)
Cervical Sprain °A cervical sprain is when the tissues (ligaments) that hold the neck bones in place stretch or tear. °HOME CARE  °· Put ice on the injured area. °· Put ice in a plastic bag. °· Place a towel between your skin and the bag. °· Leave the ice on for 15-20 minutes, 3-4 times a day. °· You may have been given a collar to wear. This collar keeps your neck from moving while you heal. °· Do not take the collar off unless told by your doctor. °· If you have long hair, keep it outside of the collar. °· Ask your doctor before changing the position of your collar. You may need to change its position over time to make it more comfortable. °· If you are allowed to take off the collar for cleaning or bathing, follow your doctor's instructions on how to do it safely. °· Keep your collar clean by wiping it with mild soap and water. Dry it completely. If the collar has removable pads, remove them every 1-2 days to hand wash them with soap and water. Allow them to air dry. They should be dry before you wear them in the collar. °· Do not drive while wearing the collar. °· Only take medicine as told by your doctor. °· Keep all doctor visits as told. °· Keep all physical therapy visits as told. °· Adjust your work station so that you have good posture while you work. °· Avoid positions and activities that make your problems worse. °· Warm up and stretch before being active. °GET HELP IF: °· Your pain is not controlled with medicine. °· You cannot take less pain medicine over time as planned. °· Your activity level does not improve as expected. °GET HELP RIGHT AWAY IF:  °· You are bleeding. °· Your stomach is upset. °· You have an allergic reaction to your medicine. °· You develop new problems that you cannot explain. °· You lose feeling (become numb) or you cannot move any part of your body (paralysis). °· You have tingling or weakness in any part of your body. °· Your symptoms get worse. Symptoms include: °· Pain,  soreness, stiffness, puffiness (swelling), or a burning feeling in your neck. °· Pain when your neck is touched. °· Shoulder or upper back pain. °· Limited ability to move your neck. °· Headache. °· Dizziness. °· Your hands or arms feel week, lose feeling, or tingle. °· Muscle spasms. °· Difficulty swallowing or chewing. °MAKE SURE YOU:  °· Understand these instructions. °· Will watch your condition. °· Will get help right away if you are not doing well or get worse. °  °This information is not intended to replace advice given to you by your health care provider. Make sure you discuss any questions you have with your health care provider. °  °Document Released: 05/05/2008 Document Revised: 07/20/2013 Document Reviewed: 05/25/2013 °Elsevier Interactive Patient Education ©2016 [employer] ° °Concussion, Adult °A concussion, or closed-head injury, is a brain injury caused by a direct blow to the head or by a quick and sudden movement (jolt) of the head or neck. Concussions are usually not life-threatening. Even so, the effects of a concussion can be serious. If you have had a concussion before, you are more likely to experience concussion-like symptoms after a direct blow to the head.  °CAUSES °· Direct blow to the head, such as from running into another player during a soccer game, being hit in a fight, or hitting your head on a   hard surface. °· A jolt of the head or neck that causes the brain to move back and forth inside the skull, such as in a car crash. °SIGNS AND SYMPTOMS °The signs of a concussion can be hard to notice. Early on, they may be missed by you, family members, and health care providers. You may look fine but act or feel differently. °Symptoms are usually temporary, but they may last for days, weeks, or even longer. Some symptoms may appear right away while others may not show up for hours or days. Every head injury is different. Symptoms include: °· Mild to moderate headaches that will not go  away. °· A feeling of pressure inside your head. °· Having more trouble than usual: °¨ Learning or remembering things you have heard. °¨ Answering questions. °¨ Paying attention or concentrating. °¨ Organizing daily tasks. °¨ Making decisions and solving problems. °· Slowness in thinking, acting or reacting, speaking, or reading. °· Getting lost or being easily confused. °· Feeling tired all the time or lacking energy (fatigued). °· Feeling drowsy. °· Sleep disturbances. °¨ Sleeping more than usual. °¨ Sleeping less than usual. °¨ Trouble falling asleep. °¨ Trouble sleeping (insomnia). °· Loss of balance or feeling lightheaded or dizzy. °· Nausea or vomiting. °· Numbness or tingling. °· Increased sensitivity to: °¨ Sounds. °¨ Lights. °¨ Distractions. °· Vision problems or eyes that tire easily. °· Diminished sense of taste or smell. °· Ringing in the ears. °· Mood changes such as feeling sad or anxious. °· Becoming easily irritated or angry for little or no reason. °· Lack of motivation. °· Seeing or hearing things other people do not see or hear (hallucinations). °DIAGNOSIS °Your health care provider can usually diagnose a concussion based on a description of your injury and symptoms. He or she will ask whether you passed out (lost consciousness) and whether you are having trouble remembering events that happened right before and during your injury. °Your evaluation might include: °· A brain scan to look for signs of injury to the brain. Even if the test shows no injury, you may still have a concussion. °· Blood tests to be sure other problems are not present. °TREATMENT °· Concussions are usually treated in an emergency department, in urgent care, or at a clinic. You may need to stay in the hospital overnight for further treatment. °· Tell your health care provider if you are taking any medicines, including prescription medicines, over-the-counter medicines, and natural remedies. Some medicines, such as blood  thinners (anticoagulants) and aspirin, may increase the chance of complications. Also tell your health care provider whether you have had alcohol or are taking illegal drugs. This information may affect treatment. °· Your health care provider will send you home with important instructions to follow. °· How fast you will recover from a concussion depends on many factors. These factors include how severe your concussion is, what part of your brain was injured, your age, and how healthy you were before the concussion. °· Most people with mild injuries recover fully. Recovery can take time. In general, recovery is slower in older persons. Also, persons who have had a concussion in the past or have other medical problems may find that it takes longer to recover from their current injury. °HOME CARE INSTRUCTIONS °General Instructions °· Carefully follow the directions your health care provider gave you. °· Only take over-the-counter or prescription medicines for pain, discomfort, or fever as directed by your health care provider. °· Take only those medicines that   your health care provider has approved. °· Do not drink alcohol until your health care provider says you are well enough to do so. Alcohol and certain other drugs may slow your recovery and can put you at risk of further injury. °· If it is harder than usual to remember things, write them down. °· If you are easily distracted, try to do one thing at a time. For example, do not try to watch TV while fixing dinner. °· Talk with family members or close friends when making important decisions. °· Keep all follow-up appointments. Repeated evaluation of your symptoms is recommended for your recovery. °· Watch your symptoms and tell others to do the same. Complications sometimes occur after a concussion. Older adults with a brain injury may have a higher risk of serious complications, such as a blood clot on the brain. °· Tell your teachers, school nurse, school  counselor, coach, athletic trainer, or work manager about your injury, symptoms, and restrictions. Tell them about what you can or cannot do. They should watch for: °¨ Increased problems with attention or concentration. °¨ Increased difficulty remembering or learning new information. °¨ Increased time needed to complete tasks or assignments. °¨ Increased irritability or decreased ability to cope with stress. °¨ Increased symptoms. °· Rest. Rest helps the brain to heal. Make sure you: °¨ Get plenty of sleep at night. Avoid staying up late at night. °¨ Keep the same bedtime hours on weekends and weekdays. °¨ Rest during the day. Take daytime naps or rest breaks when you feel tired. °· Limit activities that require a lot of thought or concentration. These include: °¨ Doing homework or job-related work. °¨ Watching TV. °¨ Working on the computer. °· Avoid any situation where there is potential for another head injury (football, hockey, soccer, basketball, martial arts, downhill snow sports and horseback riding). Your condition will get worse every time you experience a concussion. You should avoid these activities until you are evaluated by the appropriate follow-up health care providers. °Returning To Your Regular Activities °You will need to return to your normal activities slowly, not all at once. You must give your body and brain enough time for recovery. °· Do not return to sports or other athletic activities until your health care provider tells you it is safe to do so. °· Ask your health care provider when you can drive, ride a bicycle, or operate heavy machinery. Your ability to react may be slower after a brain injury. Never do these activities if you are dizzy. °· Ask your health care provider about when you can return to work or school. °Preventing Another Concussion °It is very important to avoid another brain injury, especially before you have recovered. In rare cases, another injury can lead to permanent  brain damage, brain swelling, or death. The risk of this is greatest during the first 7-10 days after a head injury. Avoid injuries by: °· Wearing a seat belt when riding in a car. °· Drinking alcohol only in moderation. °· Wearing a helmet when biking, skiing, skateboarding, skating, or doing similar activities. °· Avoiding activities that could lead to a second concussion, such as contact or recreational sports, until your health care provider says it is okay. °· Taking safety measures in your home. °¨ Remove clutter and tripping hazards from floors and stairways. °¨ Use grab bars in bathrooms and handrails by stairs. °¨ Place non-slip mats on floors and in bathtubs. °¨ Improve lighting in dim areas. °SEEK MEDICAL CARE IF: °·   You have increased problems paying attention or concentrating. °· You have increased difficulty remembering or learning new information. °· You need more time to complete tasks or assignments than before. °· You have increased irritability or decreased ability to cope with stress. °· You have more symptoms than before. °Seek medical care if you have any of the following symptoms for more than 2 weeks after your injury: °· Lasting (chronic) headaches. °· Dizziness or balance problems. °· Nausea. °· Vision problems. °· Increased sensitivity to noise or light. °· Depression or mood swings. °· Anxiety or irritability. °· Memory problems. °· Difficulty concentrating or paying attention. °· Sleep problems. °· Feeling tired all the time. °SEEK IMMEDIATE MEDICAL CARE IF: °· You have severe or worsening headaches. These may be a sign of a blood clot in the brain. °· You have weakness (even if only in one hand, leg, or part of the face). °· You have numbness. °· You have decreased coordination. °· You vomit repeatedly. °· You have increased sleepiness. °· One pupil is larger than the other. °· You have convulsions. °· You have slurred speech. °· You have increased confusion. This may be a sign of a  blood clot in the brain. °· You have increased restlessness, agitation, or irritability. °· You are unable to recognize people or places. °· You have neck pain. °· It is difficult to wake you up. °· You have unusual behavior changes. °· You lose consciousness. °MAKE SURE YOU: °· Understand these instructions. °· Will watch your condition. °· Will get help right away if you are not doing well or get worse. °  °This information is not intended to replace advice given to you by your health care provider. Make sure you discuss any questions you have with your health care provider. °  °Document Released: 02/07/2004 Document Revised: 12/08/2014 Document Reviewed: 06/09/2013 °Elsevier Interactive Patient Education ©2016 [employer] ° °

## 2016-04-03 NOTE — ED Notes (Signed)
Pt states that she was at work and a box fell on her head. Pt c/o dizziness denies nausea, blurred vision and vomiting at this time.

## 2016-04-03 NOTE — ED Provider Notes (Signed)
CSN: [id number]     Arrival date & time 04/03/16  0810 History   First MD Initiated Contact with Patient 04/03/16 0815     Chief Complaint  Patient presents with  . Head Injury    no loss of consciousness    HPI Patient presents to the emergency room with complaints of a work-related injury. Yesterday while at work a box fell on the back of her head. Patient is complaining of pain in the back of her head and in her neck since that incident. She also has some dizziness and nausea she noticed some blurred vision and vomited after the event. Patient denies any numbness or weakness. She denies any other injuries. Past Medical History  Diagnosis Date  . No pertinent past medical history    Past Surgical History  Procedure Laterality Date  . No past surgeries     Family History  Problem Relation Age of Onset  . Anesthesia problems Neg Hx   . Hypotension Neg Hx   . Malignant hyperthermia Neg Hx   . Pseudochol deficiency Neg Hx    Social History  Substance Use Topics  . Smoking status: Never Smoker   . Smokeless tobacco: Never Used  . Alcohol Use: No   OB History    Gravida Para Term Preterm AB TAB SAB Ectopic Multiple Living   2 2 2  0 0 0 0 0 0 2     Review of Systems  All other systems reviewed and are negative.     Allergies  Review of patient's allergies indicates no known allergies.  Home Medications   Prior to Admission medications   Medication Sig Start Date End Date Taking? Authorizing Provider  acetaminophen (TYLENOL) 500 MG tablet Take 1,000 mg by mouth every 6 (six) hours as needed (pain).   Yes Historical Provider, MD  cyclobenzaprine (FLEXERIL) 10 MG tablet Take 1 tablet (10 mg total) by mouth 2 (two) times daily as needed for muscle spasms. 04/03/16   Linwood Dibbles, MD  naproxen (NAPROSYN) 500 MG tablet Take 1 tablet (500 mg total) by mouth 2 (two) times daily with a meal. As needed for pain 04/03/16   Linwood Dibbles, MD   BP 103/64 mmHg  Pulse 65  Temp(Src) 98 F (36.7  C) (Oral)  Resp 18  Ht 5\' 5"  (1.651 m)  Wt 65.772 kg  BMI 24.13 kg/m2  SpO2 98% Physical Exam  Constitutional: She appears well-developed and well-nourished. No distress.  HENT:  Head: Normocephalic and atraumatic.  Right Ear: External ear normal.  Left Ear: External ear normal.  Eyes: Conjunctivae are normal. Right eye exhibits no discharge. Left eye exhibits no discharge. No scleral icterus.  Neck: Neck supple. No tracheal deviation present.  Cardiovascular: Normal rate, regular rhythm and intact distal pulses.   Pulmonary/Chest: Effort normal and breath sounds normal. No stridor. No respiratory distress. She has no wheezes. She has no rales.  Abdominal: Soft. Bowel sounds are normal. She exhibits no distension. There is no tenderness. There is no rebound and no guarding.  Musculoskeletal: She exhibits no edema.       Cervical back: She exhibits tenderness and bony tenderness. She exhibits no swelling, no edema and no deformity.  Neurological: She is alert. She has normal strength. No cranial nerve deficit (no facial droop, extraocular movements intact, no slurred speech) or sensory deficit. She exhibits normal muscle tone. She displays no seizure activity. Coordination normal.  Skin: Skin is warm and dry. No rash noted.  Psychiatric: She  has a normal mood and affect.  Nursing note and vitals reviewed.   ED Course  Procedures (including critical care time) Labs Review Labs Reviewed - No data to display  Imaging Review Ct Head Wo Contrast  04/03/2016  CLINICAL DATA:  A box fell on her head 2 days ago. No LOC. Pt thought she was fine. But then began having dizziness, blurred vision, and vomiting EXAM: CT HEAD WITHOUT CONTRAST CT CERVICAL SPINE WITHOUT CONTRAST TECHNIQUE: Multidetector CT imaging of the head and cervical spine was performed following the standard protocol without intravenous contrast. Multiplanar CT image reconstructions of the cervical spine were also generated.  COMPARISON:  None. FINDINGS: CT HEAD FINDINGS There is no evidence of mass effect, midline shift or extra-axial fluid collections. There is no evidence of a space-occupying lesion or intracranial hemorrhage. There is no evidence of a cortical-based area of acute infarction. The ventricles and sulci are appropriate for the patient's age. The basal cisterns are patent. Visualized portions of the orbits are unremarkable. The visualized portions of the paranasal sinuses and mastoid air cells are unremarkable. The osseous structures are unremarkable. CT CERVICAL SPINE FINDINGS The alignment is anatomic. The vertebral body heights are maintained. There is no acute fracture. There is no static listhesis. The prevertebral soft tissues are normal. The intraspinal soft tissues are not fully imaged on this examination due to poor soft tissue contrast, but there is no gross soft tissue abnormality. The disc spaces are maintained. The visualized portions of the lung apices demonstrate no focal abnormality. IMPRESSION: 1. Normal CT of the brain. 2. Normal CT of the cervical spine. Electronically Signed   By: Elige Ko   On: 04/03/2016 09:31   Ct Cervical Spine Wo Contrast  04/03/2016  CLINICAL DATA:  A box fell on her head 2 days ago. No LOC. Pt thought she was fine. But then began having dizziness, blurred vision, and vomiting EXAM: CT HEAD WITHOUT CONTRAST CT CERVICAL SPINE WITHOUT CONTRAST TECHNIQUE: Multidetector CT imaging of the head and cervical spine was performed following the standard protocol without intravenous contrast. Multiplanar CT image reconstructions of the cervical spine were also generated. COMPARISON:  None. FINDINGS: CT HEAD FINDINGS There is no evidence of mass effect, midline shift or extra-axial fluid collections. There is no evidence of a space-occupying lesion or intracranial hemorrhage. There is no evidence of a cortical-based area of acute infarction. The ventricles and sulci are appropriate for  the patient's age. The basal cisterns are patent. Visualized portions of the orbits are unremarkable. The visualized portions of the paranasal sinuses and mastoid air cells are unremarkable. The osseous structures are unremarkable. CT CERVICAL SPINE FINDINGS The alignment is anatomic. The vertebral body heights are maintained. There is no acute fracture. There is no static listhesis. The prevertebral soft tissues are normal. The intraspinal soft tissues are not fully imaged on this examination due to poor soft tissue contrast, but there is no gross soft tissue abnormality. The disc spaces are maintained. The visualized portions of the lung apices demonstrate no focal abnormality. IMPRESSION: 1. Normal CT of the brain. 2. Normal CT of the cervical spine. Electronically Signed   By: Elige Ko   On: 04/03/2016 09:31   I have personally reviewed and evaluated these images and lab results as part of my medical decision-making.    MDM   Final diagnoses:  Concussion, without loss of consciousness, initial encounter  Cervical strain, acute, initial encounter    CT scans are reassuring. No serious injury  noted on the head CT or cervical spine CT. Patient's symptoms may be related to a mild concussion and cervical strain. She is stable to discharge home. I will give her prescription for Naprosyn and Flexeril. I discussed follow-up with occupational health doctor if she has persistent symptoms to see if any further therapy or treatment is necessary.  Patient saw a chiropractor prior to coming to the emergency room. Her job indicated that she would need a referral to the chiropractor.  I splinted the patient the symptoms may resolve on their own in the next few days. If her symptoms are persisting and she wants to go see a chiropractor and would like her work to cover that treatment I think the best bet would be for her to see an occupational health physician.    Linwood DibblesJon Suleyman Ehrman, MD 04/03/16 1018

## 2016-11-26 ENCOUNTER — Encounter (HOSPITAL_COMMUNITY): Payer: Self-pay

## 2016-11-26 ENCOUNTER — Emergency Department (HOSPITAL_COMMUNITY)
Admission: EM | Admit: 2016-11-26 | Discharge: 2016-11-26 | Disposition: A | Discharge status: Home / Self Care | Payer: Medicaid Other | Attending: Emergency Medicine | Admitting: Emergency Medicine

## 2016-11-26 DIAGNOSIS — R197 Diarrhea, unspecified: Secondary | ICD-10-CM

## 2016-11-26 LAB — LIPASE, BLOOD: LIPASE: 18 U/L (ref 11–51)

## 2016-11-26 LAB — URINALYSIS, ROUTINE W REFLEX MICROSCOPIC
BACTERIA UA: NONE SEEN
BILIRUBIN URINE: NEGATIVE
GLUCOSE, UA: NEGATIVE mg/dL
KETONES UR: NEGATIVE mg/dL
Nitrite: NEGATIVE
PH: 6 (ref 5.0–8.0)
PROTEIN: NEGATIVE mg/dL
Specific Gravity, Urine: 1.011 (ref 1.005–1.030)

## 2016-11-26 LAB — COMPREHENSIVE METABOLIC PANEL
ALT: 29 U/L (ref 14–54)
ANION GAP: 9 (ref 5–15)
AST: 30 U/L (ref 15–41)
Albumin: 4.4 g/dL (ref 3.5–5.0)
Alkaline Phosphatase: 58 U/L (ref 38–126)
BILIRUBIN TOTAL: 0.8 mg/dL (ref 0.3–1.2)
BUN: 7 mg/dL (ref 6–20)
CHLORIDE: 107 mmol/L (ref 101–111)
CO2: 20 mmol/L — ABNORMAL LOW (ref 22–32)
Calcium: 9.3 mg/dL (ref 8.9–10.3)
Creatinine, Ser: 0.54 mg/dL (ref 0.44–1.00)
Glucose, Bld: 98 mg/dL (ref 65–99)
POTASSIUM: 3.7 mmol/L (ref 3.5–5.1)
Sodium: 136 mmol/L (ref 135–145)
TOTAL PROTEIN: 7.6 g/dL (ref 6.5–8.1)

## 2016-11-26 LAB — I-STAT BETA HCG BLOOD, ED (MC, WL, AP ONLY): I-stat hCG, quantitative: <5 m[IU]/mL (ref <5)

## 2016-11-26 LAB — CBC
HEMATOCRIT: 42.9 % (ref 36.0–46.0)
Hemoglobin: 14.6 g/dL (ref 12.0–15.0)
MCH: 28.2 pg (ref 26.0–34.0)
MCHC: 34 g/dL (ref 30.0–36.0)
MCV: 82.8 fL (ref 78.0–100.0)
PLATELETS: 221 10*3/uL (ref 150–400)
RBC: 5.18 MIL/uL — AB (ref 3.87–5.11)
RDW: 13.7 % (ref 11.5–15.5)
WBC: 2.8 10*3/uL — AB (ref 4.0–10.5)

## 2016-11-26 MED ORDER — LOPERAMIDE HCL 2 MG PO CAPS
4.0000 mg | ORAL_CAPSULE | ORAL | Status: DC | PRN
  Administered 2016-11-26: 4 mg via ORAL
  Filled 2016-11-26: qty 2

## 2016-11-26 MED ORDER — ONDANSETRON 4 MG PO TBDP
ORAL_TABLET | ORAL | Status: AC
  Filled 2016-11-26: qty 1

## 2016-11-26 MED ORDER — ONDANSETRON HCL 8 MG PO TABS: 8.0000 mg | ORAL_TABLET | Freq: Three times a day (TID) | ORAL | 0 refills | Status: DC | PRN

## 2016-11-26 MED ORDER — ONDANSETRON 4 MG PO TBDP
8.0000 mg | ORAL_TABLET | Freq: Once | ORAL | Status: AC
  Administered 2016-11-26: 8 mg via ORAL
  Filled 2016-11-26: qty 2

## 2016-11-26 MED ORDER — ONDANSETRON 4 MG PO TBDP
4.0000 mg | ORAL_TABLET | Freq: Once | ORAL | Status: AC | PRN
  Administered 2016-11-26: 4 mg via ORAL

## 2016-11-26 NOTE — Discharge Planning (Signed)
Pt up for discharge. EDCM reviewed chart for possible CM needs.  No needs identified or communicated.  

## 2016-11-26 NOTE — ED Notes (Signed)
Placed patient into a gown and on the monitor 

## 2016-11-26 NOTE — ED Provider Notes (Signed)
[facility] Provider Note   CSN: [id number] Arrival date & time: 11/26/16  [id number]     History   Chief Complaint Chief Complaint  Patient presents with  . Abdominal Pain    HPI Destiny Horn is a 25 y.o. female.  She has been ill for 3 days with diarrhea, nausea and chills. Diarrhea is loose and brown in color. She has not seen any blood in it. She was unable to work today because of the illness. She has not taken her temperature at home. She does not have any other recent illnesses or chronic, ongoing medical problems.   HPI  Past Medical History:  Diagnosis Date  . No pertinent past medical history     Patient Active Problem List   Diagnosis Date Noted  . Postural dizziness 03/20/2014  . Developmental delay disorder 04/16/2012    Past Surgical History:  Procedure Laterality Date  . NO PAST SURGERIES      OB History    Gravida Para Term Preterm AB Living   2 2 2  0 0 2   SAB TAB Ectopic Multiple Live Births   0 0 0 0 2       Home Medications    Prior to Admission medications   Medication Sig Start Date End Date Taking? Authorizing Provider  cyclobenzaprine (FLEXERIL) 10 MG tablet Take 1 tablet (10 mg total) by mouth 2 (two) times daily as needed for muscle spasms. Patient not taking: Reported on 11/26/2016 04/03/16   Linwood DibblesJon Knapp, MD  naproxen (NAPROSYN) 500 MG tablet Take 1 tablet (500 mg total) by mouth 2 (two) times daily with a meal. As needed for pain Patient not taking: Reported on 11/26/2016 04/03/16   Linwood DibblesJon Knapp, MD  ondansetron (ZOFRAN) 8 MG tablet Take 1 tablet (8 mg total) by mouth every 8 (eight) hours as needed for nausea or vomiting. 11/26/16   Mancel BaleElliott Kinney Sackmann, MD    Family History Family History  Problem Relation Age of Onset  . Anesthesia problems Neg Hx   . Hypotension Neg Hx   . Malignant hyperthermia Neg Hx   . Pseudochol deficiency Neg Hx     Social History Social History  Substance Use Topics  . Smoking status: Never Smoker   . Smokeless tobacco: Never Used  . Alcohol use No     Allergies   Patient has no known allergies.   Review of Systems Review of Systems  All other systems reviewed and are negative.    Physical Exam Updated Vital Signs BP 91/64   Pulse 81   Temp 97.7 F (36.5 C) (Oral)   Resp 11   SpO2 97%   Physical Exam  Constitutional: She is oriented to person, place, and time. She appears well-developed and well-nourished.  HENT:  Head: Normocephalic and atraumatic.  Eyes: Conjunctivae and EOM are normal. Pupils are equal, round, and reactive to light.  Neck: Normal range of motion and phonation normal. Neck supple.  Cardiovascular: Normal rate and regular rhythm.   Pulmonary/Chest: Effort normal and breath sounds normal. She exhibits no tenderness.  Abdominal: Soft. She exhibits no distension. There is no tenderness. There is no guarding.  Musculoskeletal: Normal range of motion.  Neurological: She is alert and oriented to person, place, and time. She exhibits normal muscle tone.  Skin: Skin is warm and dry.  Psychiatric: She has a normal mood and affect. Her behavior is normal. Judgment and thought content normal.  Nursing note and vitals reviewed.    ED  Treatments / Results  Labs (all labs ordered are listed, but only abnormal results are displayed) Labs Reviewed  COMPREHENSIVE METABOLIC PANEL - Abnormal; Notable for the following:       Result Value   CO2 20 (*)    All other components within normal limits  CBC - Abnormal; Notable for the following:    WBC 2.8 (*)    RBC 5.18 (*)    All other components within normal limits  LIPASE, BLOOD  URINALYSIS, ROUTINE W REFLEX MICROSCOPIC  I-STAT BETA HCG BLOOD, ED ([facility], [facility], [facility] ONLY)    EKG  EKG Interpretation None       Radiology No results found.  Procedures Procedures (including critical care time)  Medications Ordered in ED Medications  loperamide (IMODIUM) capsule 4 mg (4 mg Oral Given 11/26/16 1029)    ondansetron (ZOFRAN-ODT) disintegrating tablet 4 mg (4 mg Oral Given 11/26/16 0925)  ondansetron (ZOFRAN-ODT) disintegrating tablet 8 mg (8 mg Oral Given 11/26/16 1029)     Initial Impression / Assessment and Plan / ED Course  I have reviewed the triage vital signs and the nursing notes.  Pertinent labs & imaging results that were available during my care of the patient were reviewed by me and considered in my medical decision making (see chart for details).  Clinical Course     Medications  loperamide (IMODIUM) capsule 4 mg (4 mg Oral Given 11/26/16 1029)  ondansetron (ZOFRAN-ODT) disintegrating tablet 4 mg (4 mg Oral Given 11/26/16 0925)  ondansetron (ZOFRAN-ODT) disintegrating tablet 8 mg (8 mg Oral Given 11/26/16 1029)    Patient Vitals for the past 24 hrs:  BP Temp Temp src Pulse Resp SpO2  11/26/16 1100 91/64 - - 81 11 97 %  11/26/16 1045 93/65 - - 80 11 98 %  11/26/16 0922 105/69 97.7 F (36.5 C) Oral 89 18 97 %    11:22 AM Reevaluation with update and discussion. After initial assessment and treatment, an updated evaluation reveals She is comfortable. No further complaints. Findings discussed with the patient and all questions were answered. Ann-Marie Kluge L    Final Clinical Impressions(s) / ED Diagnoses   Final diagnoses:  Diarrhea, unspecified type   Nonspecific diarrhea, nontoxic, patient with negative ED evaluation.  Nursing Notes Reviewed/ Care Coordinated Applicable Imaging Reviewed Interpretation of Laboratory Data incorporated into ED treatment  The patient appears reasonably screened and/or stabilized for discharge and I doubt any other medical condition or other [facility] requiring further screening, evaluation, or treatment in the ED at this time prior to discharge.  Plan: Home Medications- Immodium prn; Home Treatments- rest, fluids; return here if the recommended treatment, does not improve the symptoms; Recommended follow up- PCP prn   New  Prescriptions New Prescriptions   ONDANSETRON (ZOFRAN) 8 MG TABLET    Take 1 tablet (8 mg total) by mouth every 8 (eight) hours as needed for nausea or vomiting.     Mancel BaleElliott Alexiz Cothran, MD 11/26/16 1126

## 2016-11-26 NOTE — Discharge Instructions (Signed)
Use Imodium as needed for diarrhea.  Drink plenty of fluids.

## 2016-11-26 NOTE — ED Triage Notes (Signed)
Pt presents for evaluation of lower abd pain with nausea and diarrhea x 2 days. Pt. Denies vomiting. Pt states she took peptobismol with no improvement. Pt reports decreased PO intake. Pt. AxO x4.

## 2017-04-20 ENCOUNTER — Encounter (HOSPITAL_COMMUNITY): Payer: Self-pay | Admitting: Emergency Medicine

## 2017-04-20 ENCOUNTER — Ambulatory Visit (HOSPITAL_COMMUNITY)
Admission: EM | Admit: 2017-04-20 | Discharge: 2017-04-20 | Disposition: A | Discharge status: Home / Self Care | Payer: Medicaid Other | Attending: Family Medicine | Admitting: Family Medicine

## 2017-04-20 DIAGNOSIS — R21 Rash and other nonspecific skin eruption: Secondary | ICD-10-CM | POA: Diagnosis not present

## 2017-04-20 DIAGNOSIS — L03116 Cellulitis of left lower limb: Secondary | ICD-10-CM

## 2017-04-20 MED ORDER — DOXYCYCLINE HYCLATE 100 MG PO CAPS: 100.0000 mg | ORAL_CAPSULE | Freq: Two times a day (BID) | ORAL | 0 refills | Status: DC

## 2017-04-20 MED ORDER — KETOCONAZOLE 2 % EX CREA: 1.0000 "application " | TOPICAL_CREAM | Freq: Two times a day (BID) | CUTANEOUS | 0 refills | Status: DC

## 2017-04-20 NOTE — ED Triage Notes (Signed)
Noticed area to left lower leg one week ago.  Area is red and itching.  This is an open wound

## 2017-04-20 NOTE — ED Provider Notes (Signed)
CSN: [id number]     Arrival date & time 04/20/17  1522 History   None    Chief Complaint  Patient presents with  . Insect Bite   (Consider location/radiation/quality/duration/timing/severity/associated sxs/prior Treatment) Patient c/o left lower ankle swelling and discomfort.  Patient had an abrasion and now it is sore and tender.  She has a rash on her chest.   The history is provided by the patient.  Rash  Location:  Leg Leg rash location:  L ankle Quality: itchiness and redness   Severity:  Moderate Onset quality:  Sudden Duration:  5 days Timing:  Constant Progression:  Worsening Chronicity:  New Relieved by:  Nothing Worsened by:  Nothing Ineffective treatments:  None tried   Past Medical History:  Diagnosis Date  . No pertinent past medical history    Past Surgical History:  Procedure Laterality Date  . NO PAST SURGERIES     Family History  Problem Relation Age of Onset  . Anesthesia problems Neg Hx   . Hypotension Neg Hx   . Malignant hyperthermia Neg Hx   . Pseudochol deficiency Neg Hx    Social History  Substance Use Topics  . Smoking status: Never Smoker  . Smokeless tobacco: Never Used  . Alcohol use No   OB History    Gravida Para Term Preterm AB Living   2 2 2  0 0 2   SAB TAB Ectopic Multiple Live Births   0 0 0 0 2     Review of Systems  Constitutional: Negative.   HENT: Negative.   Eyes: Negative.   Respiratory: Negative.   Cardiovascular: Negative.   Gastrointestinal: Negative.   Endocrine: Negative.   Genitourinary: Negative.   Musculoskeletal: Negative.   Skin: Positive for rash.  Allergic/Immunologic: Negative.   Neurological: Negative.     Allergies  Patient has no known allergies.  Home Medications   Prior to Admission medications   Medication Sig Start Date End Date Taking? Authorizing Provider  cyclobenzaprine (FLEXERIL) 10 MG tablet Take 1 tablet (10 mg total) by mouth 2 (two) times daily as needed for muscle  spasms. Patient not taking: Reported on 11/26/2016 04/03/16   Linwood Dibbles, MD  doxycycline (VIBRAMYCIN) 100 MG capsule Take 1 capsule (100 mg total) by mouth 2 (two) times daily. 04/20/17   Deatra Canter, FNP  ketoconazole (NIZORAL) 2 % cream Apply 1 application topically 2 (two) times daily. 04/20/17   Deatra Canter, FNP  naproxen (NAPROSYN) 500 MG tablet Take 1 tablet (500 mg total) by mouth 2 (two) times daily with a meal. As needed for pain Patient not taking: Reported on 11/26/2016 04/03/16   Linwood Dibbles, MD  ondansetron (ZOFRAN) 8 MG tablet Take 1 tablet (8 mg total) by mouth every 8 (eight) hours as needed for nausea or vomiting. 11/26/16   Mancel Bale, MD   Meds Ordered and Administered this Visit  Medications - No data to display  BP 118/70 (BP Location: Right Arm)   Pulse 89   Temp 99 F (37.2 C) (Oral)   Resp 14   SpO2 95%  No data found.   Physical Exam  Constitutional: She is oriented to person, place, and time. She appears well-developed and well-nourished.  HENT:  Head: Normocephalic and atraumatic.  Eyes: Conjunctivae and EOM are normal. Pupils are equal, round, and reactive to light.  Neck: Normal range of motion. Neck supple.  Cardiovascular: Normal rate, regular rhythm and normal heart sounds.   Pulmonary/Chest: Effort normal and  breath sounds normal.  Neurological: She is alert and oriented to person, place, and time.  Nursing note and vitals reviewed.   Urgent Care Course     Procedures (including critical care time)  Labs Review Labs Reviewed - No data to display  Imaging Review No results found.   Visual Acuity Review  Right Eye Distance:   Left Eye Distance:   Bilateral Distance:    Right Eye Near:   Left Eye Near:    Bilateral Near:         MDM   1. Rash   2. Cellulitis of left ankle    doxycycline 100mg  po bid x 10 days Nizoral cream  Apply bid #30grams w/1rf      Deatra CanterOxford, Acheron Sugg J, FNP 04/20/17 1708

## 2017-05-26 ENCOUNTER — Encounter (HOSPITAL_COMMUNITY): Payer: Self-pay | Admitting: Emergency Medicine

## 2017-05-26 ENCOUNTER — Ambulatory Visit (HOSPITAL_COMMUNITY)
Admission: EM | Admit: 2017-05-26 | Discharge: 2017-05-26 | Disposition: A | Discharge status: Home / Self Care | Payer: Medicaid Other | Attending: Family Medicine | Admitting: Family Medicine

## 2017-05-26 DIAGNOSIS — R05 Cough: Secondary | ICD-10-CM | POA: Diagnosis not present

## 2017-05-26 DIAGNOSIS — R059 Cough, unspecified: Secondary | ICD-10-CM

## 2017-05-26 MED ORDER — BENZONATATE 100 MG PO CAPS: 100.0000 mg | ORAL_CAPSULE | Freq: Three times a day (TID) | ORAL | 0 refills | Status: DC

## 2017-05-26 MED ORDER — AZITHROMYCIN 250 MG PO TABS: 250.0000 mg | ORAL_TABLET | Freq: Every day | ORAL | 0 refills | Status: DC

## 2017-05-26 NOTE — ED Triage Notes (Signed)
The patient presented to the [facility] with a complaint of a cough x 1 week. 

## 2017-05-26 NOTE — ED Provider Notes (Signed)
  [facility]   [id number] 05/26/17 Arrival Time: 1531  ASSESSMENT & PLAN:  1. Cough     Meds ordered this encounter  Medications  . azithromycin (ZITHROMAX) 250 MG tablet    Sig: Take 1 tablet (250 mg total) by mouth daily. Take first 2 tablets together, then 1 every day until finished.    Dispense:  6 tablet    Refill:  0  . benzonatate (TESSALON) 100 MG capsule    Sig: Take 1 capsule (100 mg total) by mouth every 8 (eight) hours.    Dispense:  21 capsule    Refill:  0   Question atypical with her persistent coughing. May continue OTC.  Reviewed expectations re: course of current medical issues. Questions answered. Outlined signs and symptoms indicating need for more acute intervention. Follow up here or in the Emergency Department if worsening. Patient verbalized understanding. After Visit Summary given.   SUBJECTIVE:  Destiny Horn is a 26 y.o. female who presents with complaint of persistent cough. URI symptoms for 7-10 days. These improving but not cough. Non-productive. No wheezing or SOB. No CP. OTC meds without much relief. Non-smoker. Afebrile.  ROS: As per HPI.   OBJECTIVE:  Vitals:   05/26/17 1542  BP: 113/60  Pulse: (!) 104  Resp: 18  Temp: 98.5 F (36.9 C)  TempSrc: Oral  SpO2: 98%     General appearance: alert, cooperative, appears stated age and no distress Eyes: conjunctivae/corneas clear. PERRL, EOM's intact. Ears: normal TM's and external ear canals both ears Nose: Nares normal. Mucosa normal. No drainage or sinus tenderness. Throat: slight post nasal drainage Neck: no adenopathy and supple, symmetrical, trachea midline Back: symmetric, no curvature. ROM normal. No CVA tenderness. Lungs: clear to auscultation bilaterally; active, dry cough; no resp difficulty Heart: regular rate and rhythm   No Known Allergies  PMHx, SurgHx, SocialHx, Medications, and Allergies were reviewed in the Visit Navigator and updated as  appropriate.       Mardella LaymanHagler, Veretta Sabourin, MD 05/26/17 [id number]

## 2017-06-17 ENCOUNTER — Ambulatory Visit: Payer: Medicaid Other | Admitting: Obstetrics

## 2017-07-30 ENCOUNTER — Ambulatory Visit: Payer: Medicaid Other | Admitting: Obstetrics

## 2017-08-11 ENCOUNTER — Encounter: Payer: Self-pay | Admitting: Obstetrics

## 2017-08-11 ENCOUNTER — Ambulatory Visit (INDEPENDENT_AMBULATORY_CARE_PROVIDER_SITE_OTHER): Payer: Medicaid Other | Admitting: Obstetrics

## 2017-08-11 VITALS — BP 113/68 | HR 83 | Wt 159.0 lb

## 2017-08-11 DIAGNOSIS — Z3046 Encounter for surveillance of implantable subdermal contraceptive: Secondary | ICD-10-CM

## 2017-08-11 DIAGNOSIS — Z3049 Encounter for surveillance of other contraceptives: Secondary | ICD-10-CM

## 2017-08-11 NOTE — Progress Notes (Signed)
Subjective:    Destiny Horn is a 26 y.o. female who presents for contraceptive surveillance. The patient has complaints scarring at site og Nexplanon  Removal / Reinsertion ~ 2 years ago. The patient is sexually active. Pertinent past medical history: none.  The information documented in the HPI was reviewed and verified.  Menstrual History: OB History    Gravida Para Term Preterm AB Living   2 2 2  0 0 2   SAB TAB Ectopic Multiple Live Births   0 0 0 0 2       No LMP recorded. Patient has had an implant.   Patient Active Problem List   Diagnosis Date Noted  . Postural dizziness 03/20/2014  . Developmental delay disorder 04/16/2012   Past Medical History:  Diagnosis Date  . No pertinent past medical history     Past Surgical History:  Procedure Laterality Date  . NO PAST SURGERIES       Current Outpatient Prescriptions:  .  etonogestrel (NEXPLANON) 68 MG IMPL implant, 1 each by Subdermal route once., Disp: , Rfl:  .  azithromycin (ZITHROMAX) 250 MG tablet, Take 1 tablet (250 mg total) by mouth daily. Take first 2 tablets together, then 1 every day until finished. (Patient not taking: Reported on 08/11/2017), Disp: 6 tablet, Rfl: 0 .  benzonatate (TESSALON) 100 MG capsule, Take 1 capsule (100 mg total) by mouth every 8 (eight) hours. (Patient not taking: Reported on 08/11/2017), Disp: 21 capsule, Rfl: 0 No Known Allergies  Social History  Substance Use Topics  . Smoking status: Never Smoker  . Smokeless tobacco: Never Used  . Alcohol use No    Family History  Problem Relation Age of Onset  . Anesthesia problems Neg Hx   . Hypotension Neg Hx   . Malignant hyperthermia Neg Hx   . Pseudochol deficiency Neg Hx        Review of Systems Constitutional: negative for weight loss Genitourinary:negative for abnormal menstrual periods and vaginal discharge   Objective:   BP 113/68   Pulse 83   Wt 159 lb (72.1 kg)   BMI 26.46 kg/m    PE:          General:   Alert and no distress          Left Upper Arm:  Healed scar at Nexplanon insertion site, non tender.  Rod palpated, intact and nontender  Lab Review Urine pregnancy test Labs reviewed yes Radiologic studies reviewed no  50% of 15 min visit spent on counseling and coordination of care.    Assessment:    26 y.o., continuing Nexplanon, no contraindications.   Plan:    All questions answered. Diagnosis explained in detail, including differential. Follow up in 10 months.  Nexplanon Removal / Reinsertion  Meds ordered this encounter  Medications  . etonogestrel (NEXPLANON) 68 MG IMPL implant    Sig: 1 each by Subdermal route once.   No orders of the defined types were placed in this encounter.

## 2017-08-11 NOTE — Progress Notes (Signed)
Patient is having a strange sensation at her Nexplanon site. She has some swelling at the insertion site. She states the swelling at the insertion site never went down. Patient states she has a "throbbing' sensation in her arm. Patient has been having a cycle since she has had the nexplanon.

## 2017-10-28 ENCOUNTER — Encounter (HOSPITAL_COMMUNITY): Payer: Self-pay | Admitting: Emergency Medicine

## 2017-10-28 ENCOUNTER — Other Ambulatory Visit: Payer: Self-pay

## 2017-10-28 ENCOUNTER — Ambulatory Visit (HOSPITAL_COMMUNITY)
Admission: EM | Admit: 2017-10-28 | Discharge: 2017-10-28 | Disposition: A | Discharge status: Home / Self Care | Payer: Medicaid Other | Attending: Family Medicine | Admitting: Family Medicine

## 2017-10-28 DIAGNOSIS — J069 Acute upper respiratory infection, unspecified: Secondary | ICD-10-CM | POA: Diagnosis not present

## 2017-10-28 DIAGNOSIS — J029 Acute pharyngitis, unspecified: Secondary | ICD-10-CM | POA: Diagnosis present

## 2017-10-28 LAB — POCT RAPID STREP A: STREPTOCOCCUS, GROUP A SCREEN (DIRECT): NEGATIVE

## 2017-10-28 MED ORDER — AZITHROMYCIN 250 MG PO TABS: ORAL_TABLET | ORAL | 0 refills | Status: DC

## 2017-10-28 NOTE — ED Notes (Signed)
Documentation by octavia richard , rma 

## 2017-10-28 NOTE — ED Triage Notes (Signed)
Per pt she have sore throat. Per pt it started 1 week ago and no mucus..../or

## 2017-10-28 NOTE — ED Provider Notes (Signed)
[facility]    CSN: [id number] Arrival date & time: 10/28/17  [id number]     History   Chief Complaint Chief Complaint  Patient presents with  . Sore Throat    HPI Destiny Horn is a 26 y.o. female.   Destiny Horn presents with complaints of worsening sore throat. She has had cough and sore throat for the past two weeks. Denies ear pain, gi/gu complaints. No known fevers. Has been taking tylenol cough and cold which has not helped. Her boyfriend also has illness. Cough is non productive. Denies shortness of breath. Pain is worse with cough and with swallowing. Without skin rash.   ROS per HPI.       Past Medical History:  Diagnosis Date  . No pertinent past medical history     Patient Active Problem List   Diagnosis Date Noted  . Postural dizziness 03/20/2014  . Developmental delay disorder 04/16/2012    Past Surgical History:  Procedure Laterality Date  . NO PAST SURGERIES      OB History    Gravida Para Term Preterm AB Living   2 2 2  0 0 2   SAB TAB Ectopic Multiple Live Births   0 0 0 0 2       Home Medications    Prior to Admission medications   Medication Sig Start Date End Date Taking? Authorizing Provider  azithromycin (ZITHROMAX) 250 MG tablet Take first 2 tablets together, then 1 every day until finished. 10/28/17   Georgetta HaberBurky, Matthias Bogus B, NP  etonogestrel (NEXPLANON) 68 MG IMPL implant 1 each by Subdermal route once.    [provider]    Family History Family History  Problem Relation Age of Onset  . Anesthesia problems Neg Hx   . Hypotension Neg Hx   . Malignant hyperthermia Neg Hx   . Pseudochol deficiency Neg Hx     Social History Social History   Tobacco Use  . Smoking status: Never Smoker  . Smokeless tobacco: Never Used  Substance Use Topics  . Alcohol use: No    Alcohol/week: 0.0 oz  . Drug use: No     Allergies   Patient has no known allergies.   Review of Systems Review of Systems   Physical  Exam Triage Vital Signs ED Triage Vitals [10/28/17 1016]  Enc Vitals Group     BP 110/64     Pulse Rate 92     Resp      Temp 98.1 F (36.7 C)     Temp Source Oral     SpO2 96 %     Weight      Height      Head Circumference      Peak Flow      Pain Score 10     Pain Loc      Pain Edu?      Excl. in GC?    No data found.  Updated Vital Signs BP 110/64 (BP Location: Left Arm)   Pulse 92   Temp 98.1 F (36.7 C) (Oral)   SpO2 96%   Visual Acuity Right Eye Distance:   Left Eye Distance:   Bilateral Distance:    Right Eye Near:   Left Eye Near:    Bilateral Near:     Physical Exam  Constitutional: She is oriented to person, place, and time. She appears well-developed and well-nourished. No distress.  HENT:  Head: Normocephalic and atraumatic.  Right Ear: Tympanic membrane, external ear and  ear canal normal.  Left Ear: Tympanic membrane, external ear and ear canal normal.  Nose: Nose normal.  Mouth/Throat: Uvula is midline, oropharynx is clear and moist and mucous membranes are normal. No tonsillar abscesses. No tonsillar exudate.  Eyes: Conjunctivae and EOM are normal. Pupils are equal, round, and reactive to light.  Cardiovascular: Normal rate, regular rhythm and normal heart sounds.  Pulmonary/Chest: Effort normal and breath sounds normal.  Strong dry cough noted; hoarse voice  Lymphadenopathy:    She has cervical adenopathy.  Neurological: She is alert and oriented to person, place, and time.  Skin: Skin is warm and dry.     UC Treatments / Results  Labs (all labs ordered are listed, but only abnormal results are displayed) Labs Reviewed  CULTURE, GROUP A STREP [facility]  POCT RAPID STREP A    EKG  EKG Interpretation None       Radiology No results found.  Procedures Procedures (including critical care time)  Medications Ordered in UC Medications - No data to display   Initial Impression / Assessment and Plan / UC Course  I have reviewed  the triage vital signs and the nursing notes.  Pertinent labs & imaging results that were available during my care of the patient were reviewed by me and considered in my medical decision making (see chart for details).     Negative strep today. Symptoms worsening for the past two weeks. Push fluids to ensure adequate hydration and keep secretions thin. Complete course of antibiotics. Tylenol and/or ibuprofen as needed for pain or fevers. Throat lozenges or sprays as needed for sore throat. If symptoms worsen or do not improve in the next week to return to be seen or to follow up with PCP. Patient verbalized understanding and agreeable to plan.      Final Clinical Impressions(s) / UC Diagnoses   Final diagnoses:  Upper respiratory tract infection, unspecified type    ED Discharge Orders        Ordered    azithromycin (ZITHROMAX) 250 MG tablet     10/28/17 1025       Controlled Substance Prescriptions [place] Controlled Substance Registry consulted? Not Applicable   Georgetta HaberBurky, Pollyanna Levay B, NP 10/28/17 1035

## 2017-10-30 LAB — CULTURE, GROUP A STREP (THRC)

## 2018-04-06 ENCOUNTER — Encounter (HOSPITAL_COMMUNITY): Payer: Self-pay | Admitting: Emergency Medicine

## 2018-04-06 ENCOUNTER — Emergency Department (HOSPITAL_COMMUNITY)
Admission: EM | Admit: 2018-04-06 | Discharge: 2018-04-06 | Discharge status: Against Medical Advice | Payer: Medicaid Other | Attending: Emergency Medicine | Admitting: Emergency Medicine

## 2018-04-06 DIAGNOSIS — Z5321 Procedure and treatment not carried out due to patient leaving prior to being seen by health care provider: Secondary | ICD-10-CM | POA: Insufficient documentation

## 2018-04-06 NOTE — ED Triage Notes (Signed)
Pt repots since she was 27yo she has had issues after having BM when she wipes will see bright red blood. reports that stools are hard at times and will hurt nut they are also soft as well.

## 2018-06-24 ENCOUNTER — Ambulatory Visit (INDEPENDENT_AMBULATORY_CARE_PROVIDER_SITE_OTHER): Payer: Medicaid Other | Admitting: Obstetrics

## 2018-06-24 ENCOUNTER — Encounter: Payer: Self-pay | Admitting: Obstetrics

## 2018-06-24 ENCOUNTER — Other Ambulatory Visit (HOSPITAL_COMMUNITY)
Admission: RE | Admit: 2018-06-24 | Discharge: 2018-06-24 | Disposition: A | Discharge status: Home / Self Care | Payer: Medicaid Other | Source: Ambulatory Visit | Attending: Obstetrics | Admitting: Obstetrics

## 2018-06-24 VITALS — BP 128/71 | HR 89 | Ht 63.0 in | Wt 158.0 lb

## 2018-06-24 DIAGNOSIS — Z0000 Encounter for general adult medical examination without abnormal findings: Secondary | ICD-10-CM | POA: Diagnosis not present

## 2018-06-24 DIAGNOSIS — Z01419 Encounter for gynecological examination (general) (routine) without abnormal findings: Secondary | ICD-10-CM

## 2018-06-24 DIAGNOSIS — Z975 Presence of (intrauterine) contraceptive device: Secondary | ICD-10-CM

## 2018-06-24 DIAGNOSIS — Z3009 Encounter for other general counseling and advice on contraception: Secondary | ICD-10-CM

## 2018-06-24 NOTE — Progress Notes (Signed)
Subjective:        Destiny Horn is a 27 y.o. female here for a routine exam.  Current complaints: Mood swings since Nexplanon inserted..    Personal health questionnaire:  Is patient Ashkenazi Jewish, have a family history of breast and/or ovarian cancer: no Is there a family history of uterine cancer diagnosed at age < 8350, gastrointestinal cancer, urinary tract cancer, family member who is a Personnel officerLynch syndrome-associated carrier: no Is the patient overweight and hypertensive, family history of diabetes, personal history of gestational diabetes, preeclampsia or PCOS: no Is patient over 4355, have PCOS,  family history of premature CHD under age 26, diabetes, smoke, have hypertension or peripheral artery disease:  no At any time, has a partner hit, kicked or otherwise hurt or frightened you?: no Over the past 2 weeks, have you felt down, depressed or hopeless?: no Over the past 2 weeks, have you felt little interest or pleasure in doing things?:no   Gynecologic History No LMP recorded. Patient has had an implant. Contraception: Nexplanon Last Pap: unknown. Results were: unknown Last mammogram: n/a. Results were: n/a  Obstetric History OB History  Gravida Para Term Preterm AB Living  2 2 2  0 0 2  SAB TAB Ectopic Multiple Live Births  0 0 0 0 2    # Outcome Date GA Lbr Len/2nd Weight Sex Delivery Anes PTL Lv  2 Term 07/12/12 8581w5d 03:06 / 00:12 6 lb 14.9 oz (3.144 kg) F Vag-Spont EPI  LIV     Birth Comments: WNL  1 Term 2011 5467w0d 48:00 7 lb 6 oz (3.345 kg) M Vag-Spont EPI  LIV    Past Medical History:  Diagnosis Date  . No pertinent past medical history     Past Surgical History:  Procedure Laterality Date  . NO PAST SURGERIES       Current Outpatient Medications:  .  etonogestrel (NEXPLANON) 68 MG IMPL implant, 1 each by Subdermal route once., Disp: , Rfl:  .  azithromycin (ZITHROMAX) 250 MG tablet, Take first 2 tablets together, then 1 every day until finished.  (Patient not taking: Reported on 06/24/2018), Disp: 6 tablet, Rfl: 0 No Known Allergies  Social History   Tobacco Use  . Smoking status: Never Smoker  . Smokeless tobacco: Never Used  Substance Use Topics  . Alcohol use: Yes    Alcohol/week: 0.0 oz    Comment: occasionally    Family History  Problem Relation Age of Onset  . Anesthesia problems Neg Hx   . Hypotension Neg Hx   . Malignant hyperthermia Neg Hx   . Pseudochol deficiency Neg Hx       Review of Systems  Constitutional: negative for fatigue and weight loss Respiratory: negative for cough and wheezing Cardiovascular: negative for chest pain, fatigue and palpitations Gastrointestinal: negative for abdominal pain and change in bowel habits Musculoskeletal:negative for myalgias Neurological: negative for gait problems and tremors Behavioral/Psych: negative for abusive relationship, depression.  POSITIVE for mood swings Endocrine: negative for temperature intolerance    Genitourinary:negative for abnormal menstrual periods, genital lesions, hot flashes, sexual problems and vaginal discharge Integument/breast: negative for breast lump, breast tenderness, nipple discharge and skin lesion(s)    Objective:       BP 128/71   Pulse 89   Ht 5\' 3"  (1.6 m)   Wt 158 lb (71.7 kg)   BMI 27.99 kg/m  General:   alert  Skin:   no rash or abnormalities  Lungs:   clear to  auscultation bilaterally  Heart:   regular rate and rhythm, S1, S2 normal, no murmur, click, rub or gallop  Breasts:   normal without suspicious masses, skin or nipple changes or axillary nodes  Abdomen:  normal findings: no organomegaly, soft, non-tender and no hernia  Pelvis:  External genitalia: normal general appearance Urinary system: urethral meatus normal and bladder without fullness, nontender Vaginal: normal without tenderness, induration or masses Cervix: normal appearance Adnexa: normal bimanual exam Uterus: anteverted and non-tender, normal size    Lab Review Urine pregnancy test Labs reviewed yes Radiologic studies reviewed nox:  50% of 20 min visit spent on counseling and coordination of care.   Assessment:     1. Encounter for gynecological examination with Papanicolaou smear of cervix R - Cytology - PAP  2. Nexplanon in place - wants Nexplanon removed  3. Encounter for other general counseling and advice on contraception - wants to start OCP's after Nexplanon removal ( considering Loestrin 24 Fe    Plan:    Education reviewed: calcium supplements, depression evaluation, low fat, low cholesterol diet, safe sex/STD prevention, self breast exams and weight bearing exercise. Contraception: OCP (estrogen/progesterone). Follow up in: 2 weeks.   No orders of the defined types were placed in this encounter.  No orders of the defined types were placed in this encounter.   Brock Bad MD [id number]

## 2018-06-24 NOTE — Progress Notes (Signed)
Patient is in the office for annual. Pt wants to discuss possibly getting nexplanon removed because she thinks that it may be causing mood swings.

## 2018-06-30 LAB — CYTOLOGY - PAP: DIAGNOSIS: NEGATIVE

## 2018-07-08 ENCOUNTER — Encounter: Payer: Self-pay | Admitting: *Deleted

## 2018-07-08 ENCOUNTER — Ambulatory Visit (INDEPENDENT_AMBULATORY_CARE_PROVIDER_SITE_OTHER): Payer: Medicaid Other | Admitting: Obstetrics

## 2018-07-08 ENCOUNTER — Encounter: Payer: Self-pay | Admitting: Obstetrics

## 2018-07-08 VITALS — BP 106/75 | HR 70 | Wt 158.6 lb

## 2018-07-08 DIAGNOSIS — Z3046 Encounter for surveillance of implantable subdermal contraceptive: Secondary | ICD-10-CM

## 2018-07-08 NOTE — Progress Notes (Signed)
Pt presents for Nexplanon removal d/t mood swings. She does not desire BC at this time.

## 2018-07-08 NOTE — Progress Notes (Signed)
NEXPLANON REMOVAL NOTE  Date of LMP:   unknown  Contraception used: *Nexplanon   Indications:  The patient desires contraception.  She understands risks, benefits, and alternatives to Implanon and would like to proceed.  Anesthesia:   Lidocaine 1% plain.  Procedure:  A time-out was performed confirming the procedure and the patient's allergy status.  Complications: None                      The rod was palpated and the area was sterilely prepped.  The area beneath the distal tip was anesthetized with 1% xylocaine and the skin incised                       Over the tip and the tip was exposed, grasped with forcep and removed intact.  A single suture of 4-0 Vicryl was used to close incision.  Steri strip                       And a bandage applied and the arm was wrapped with gauze bandage.  The patient tolerated well.  Instructions:  The patient was instructed to remove the dressing in 24 hours and that some bruising is to be expected.  She was advised to use over the counter analgesics as needed for any pain at the site.  She is to keep the area dry for 24 hours and to call if her hand or arm becomes cold, numb, or blue.  Return visit:  Return in 2 weeks   Brock BadHARLES A. HARPER MD 07-08-2018

## 2018-07-20 ENCOUNTER — Ambulatory Visit: Payer: Medicaid Other | Admitting: Obstetrics

## 2018-07-27 ENCOUNTER — Encounter: Payer: Self-pay | Admitting: Family Medicine

## 2018-07-27 ENCOUNTER — Ambulatory Visit: Payer: Self-pay | Attending: Family Medicine | Admitting: Family Medicine

## 2018-07-27 ENCOUNTER — Other Ambulatory Visit: Payer: Self-pay

## 2018-07-27 ENCOUNTER — Telehealth: Payer: Self-pay

## 2018-07-27 VITALS — BP 113/77 | HR 77 | Temp 98.7°F | Resp 18 | Ht 64.0 in | Wt 158.6 lb

## 2018-07-27 DIAGNOSIS — F419 Anxiety disorder, unspecified: Secondary | ICD-10-CM

## 2018-07-27 DIAGNOSIS — F064 Anxiety disorder due to known physiological condition: Secondary | ICD-10-CM | POA: Insufficient documentation

## 2018-07-27 MED ORDER — SERTRALINE HCL 50 MG PO TABS: 50.0000 mg | ORAL_TABLET | Freq: Every day | ORAL | 1 refills | Status: DC

## 2018-07-27 NOTE — Progress Notes (Signed)
Patient stated her main concern today was blood in her stool sometimes and depression.

## 2018-07-27 NOTE — Telephone Encounter (Signed)
Patient had an appointment today in our office, pcp requested for social worker to follow up with patient once back in office. Nurse will keep phq9 sheet at desk for social worker once ready.

## 2018-07-27 NOTE — Patient Instructions (Signed)

## 2018-07-27 NOTE — Progress Notes (Signed)
Subjective:    Patient ID: Destiny Horn, female    DOB: 16-Nov-1991, 27 y.o.   MRN: [id number]  HPI        27 yo female new to the practice. Patient with complaint of worrying a lot.  Patient states that she has a 20-year-old son and an 85-year-old daughter and she is recently become a single parent.  Patient states she has difficulty falling asleep secondary to worrying about bills.  Patient states that her current job does not pay very well when she is trying to find another job and is in the process of filling out an application.  Patient denies any suicidal thoughts or ideations.  Patient denies any episodes of tearfulness or crying.  Patient however states that she does constantly feel anxious. Patient reports no significant past medical issues. (per ED notes on chart review, patient has had issues with postural dizziness and developmental delay disorder).   Past Medical History:  Diagnosis Date  . No pertinent past medical history    Surgical History: recent removal of Nexplanon on 07/18/18 by Dr. Coral Ceo  Social History   Tobacco Use  . Smoking status: Never Smoker  . Smokeless tobacco: Never Used  Substance Use Topics  . Alcohol use: Yes    Alcohol/week: 0.0 standard drinks    Comment: occasionally  . Drug use: No   Family History  Problem Relation Age of Onset  . Anesthesia problems Neg Hx   . Hypotension Neg Hx   . Malignant hyperthermia Neg Hx   . Pseudochol deficiency Neg Hx    No Known Allergies   Review of Systems  Constitutional: Positive for fatigue. Negative for chills and fever.  HENT: Negative for dental problem and trouble swallowing.   Respiratory: Negative for cough and shortness of breath.   Cardiovascular: Negative for chest pain, palpitations and leg swelling.  Gastrointestinal: Positive for blood in stool (resolved per patient; per chart, patient went to ED but left without being seen for this problem). Negative for abdominal pain.    Genitourinary: Negative for dysuria and frequency.  Musculoskeletal: Negative for back pain and gait problem.  Neurological: Positive for headaches (occasional). Negative for dizziness and light-headedness.  Psychiatric/Behavioral: Negative for suicidal ideas. The patient is nervous/anxious.        Objective:   Physical Exam BP 113/77 (BP Location: Left Arm, Patient Position: Sitting, Cuff Size: Normal)   Pulse 77   Temp 98.7 F (37.1 C) (Oral)   Resp 18   Ht 5\' 4"  (1.626 m)   Wt 158 lb 9.6 oz (71.9 kg)   SpO2 97%   BMI 27.22 kg/m   Vital signs reviewed General-well-nourished, well-developed female in no acute distress.   ENT-TMs gray, nares with mild edema, normal oropharynx. Neck-supple, no lymphadenopathy, no thyromegaly. Lungs-clear to auscultation bilaterally. Cardiovascular-regular rate and rhythm. Abdomen-soft, nontender. Extremities-no edema Psychologic- normal mood and judgment, patient with some mild anxiety when talking about her current social and financial pressures     Assessment & Plan:  1. Anxiety Patient with complaint of anxiety secondary to social and financial stresses.  Patient will be referred for social work consult.  Patient agrees to start Zoloft and patient was instructed to initially start half pill, 25 mg, at bedtime for 7 days then increase to whole pill, 50 mg once daily.  Patient was made aware that it may take up to 4 weeks for her to feel as well on the medication and she is going to  feel.  Patient will be contacted likely tomorrow by social work.  Patient should call or return sooner if she is having any issues with the medication, increased anxiety, suicidal thoughts ideations or any concerns.  Otherwise patient will follow-up in 4 weeks.  Patient education given on living with anxiety. - sertraline (ZOLOFT) 50 MG tablet; Take 1 tablet (50 mg total) by mouth daily. Start with 1/2 pill (25mg ) at bedtime for the first seven days  Dispense: 30 tablet;  Refill: 1  An After Visit Summary was printed and given to the patient.  Return in about 4 weeks (around 08/24/2018) for new medication follow-up.

## 2018-08-06 ENCOUNTER — Telehealth: Payer: Self-pay | Admitting: Licensed Clinical Social Worker

## 2018-08-06 NOTE — Telephone Encounter (Signed)
Called pt at both numbers on file twice to follow-up, but did not get an answer. I was unable to leave a voicemail because the voicemail box was not set up.   Norton Blizzard, MSW Intern 08/06/2018, 2:33pm

## 2018-08-11 ENCOUNTER — Telehealth: Payer: Self-pay | Admitting: Licensed Clinical Social Worker

## 2018-08-11 NOTE — Telephone Encounter (Signed)
L[facility] attempted to contact pt to follow up with behavioral health screens. Number on file is not currently working and LCSWA was unable to leave a voice message.

## 2018-08-24 ENCOUNTER — Ambulatory Visit: Payer: Medicaid Other | Admitting: Family Medicine

## 2018-12-30 ENCOUNTER — Ambulatory Visit: Payer: Medicaid Other | Admitting: Family Medicine

## 2019-02-08 ENCOUNTER — Other Ambulatory Visit: Payer: Self-pay

## 2019-02-08 ENCOUNTER — Emergency Department (HOSPITAL_COMMUNITY): Payer: No Typology Code available for payment source

## 2019-02-08 ENCOUNTER — Encounter (HOSPITAL_COMMUNITY): Payer: Self-pay | Admitting: Emergency Medicine

## 2019-02-08 ENCOUNTER — Emergency Department (HOSPITAL_COMMUNITY)
Admission: EM | Admit: 2019-02-08 | Discharge: 2019-02-08 | Disposition: A | Discharge status: Home / Self Care | Payer: No Typology Code available for payment source | Attending: Emergency Medicine | Admitting: Emergency Medicine

## 2019-02-08 DIAGNOSIS — M542 Cervicalgia: Secondary | ICD-10-CM | POA: Insufficient documentation

## 2019-02-08 DIAGNOSIS — Y999 Unspecified external cause status: Secondary | ICD-10-CM | POA: Diagnosis not present

## 2019-02-08 DIAGNOSIS — R55 Syncope and collapse: Secondary | ICD-10-CM | POA: Diagnosis not present

## 2019-02-08 DIAGNOSIS — Z79899 Other long term (current) drug therapy: Secondary | ICD-10-CM | POA: Diagnosis not present

## 2019-02-08 DIAGNOSIS — R450 Nervousness: Secondary | ICD-10-CM | POA: Diagnosis not present

## 2019-02-08 DIAGNOSIS — R42 Dizziness and giddiness: Secondary | ICD-10-CM | POA: Diagnosis not present

## 2019-02-08 DIAGNOSIS — Y9389 Activity, other specified: Secondary | ICD-10-CM | POA: Diagnosis not present

## 2019-02-08 DIAGNOSIS — M549 Dorsalgia, unspecified: Secondary | ICD-10-CM | POA: Diagnosis not present

## 2019-02-08 DIAGNOSIS — Y92009 Unspecified place in unspecified non-institutional (private) residence as the place of occurrence of the external cause: Secondary | ICD-10-CM | POA: Diagnosis not present

## 2019-02-08 LAB — PREGNANCY, URINE: Preg Test, Ur: NEGATIVE

## 2019-02-08 MED ORDER — METHOCARBAMOL 500 MG PO TABS: 500.0000 mg | ORAL_TABLET | Freq: Two times a day (BID) | ORAL | 0 refills | Status: DC

## 2019-02-08 MED ORDER — ACETAMINOPHEN 500 MG PO TABS: 500.0000 mg | ORAL_TABLET | Freq: Four times a day (QID) | ORAL | 0 refills | Status: DC | PRN

## 2019-02-08 MED ORDER — IBUPROFEN 600 MG PO TABS: 600.0000 mg | ORAL_TABLET | Freq: Four times a day (QID) | ORAL | 0 refills | Status: DC | PRN
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
## 2019-02-08 NOTE — ED Notes (Signed)
Attempted to triage x 2. Pt reminded that she needs to be triaged, but remains on the phone.
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
## 2019-02-08 NOTE — Discharge Instructions (Signed)
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
## 2019-06-27 ENCOUNTER — Encounter: Payer: Self-pay | Admitting: Obstetrics

## 2019-06-27 ENCOUNTER — Ambulatory Visit (INDEPENDENT_AMBULATORY_CARE_PROVIDER_SITE_OTHER): Payer: Medicaid Other | Admitting: Obstetrics

## 2019-06-27 ENCOUNTER — Other Ambulatory Visit (HOSPITAL_COMMUNITY)
Admission: RE | Admit: 2019-06-27 | Discharge: 2019-06-27 | Disposition: A | Discharge status: Home / Self Care | Payer: Medicaid Other | Source: Ambulatory Visit | Attending: Obstetrics | Admitting: Obstetrics

## 2019-06-27 ENCOUNTER — Other Ambulatory Visit: Payer: Self-pay

## 2019-06-27 VITALS — BP 113/60 | HR 75 | Wt 159.6 lb

## 2019-06-27 DIAGNOSIS — Z0000 Encounter for general adult medical examination without abnormal findings: Secondary | ICD-10-CM | POA: Diagnosis not present

## 2019-06-27 DIAGNOSIS — Z01419 Encounter for gynecological examination (general) (routine) without abnormal findings: Secondary | ICD-10-CM

## 2019-06-27 DIAGNOSIS — Z3009 Encounter for other general counseling and advice on contraception: Secondary | ICD-10-CM

## 2019-06-27 NOTE — Progress Notes (Signed)
Pt is G2P2 here for annual exam. Pt is not currently sexually active, she does not desire any contraception at this time. Last pap smear 06/24/18, normal. GAD score: 3

## 2019-06-27 NOTE — Progress Notes (Signed)
Subjective:        Destiny Horn is a 28 y.o. female here for a routine exam.  Current complaints: None.    Personal health questionnaire:  Is patient Ashkenazi Jewish, have a family history of breast and/or ovarian cancer: no Is there a family history of uterine cancer diagnosed at age < 29, gastrointestinal cancer, urinary tract cancer, family member who is a Field seismologist syndrome-associated carrier: no Is the patient overweight and hypertensive, family history of diabetes, personal history of gestational diabetes, preeclampsia or PCOS: no Is patient over 53, have PCOS,  family history of premature CHD under age 28, diabetes, smoke, have hypertension or peripheral artery disease:  no At any time, has a partner hit, kicked or otherwise hurt or frightened you?: no Over the past 2 weeks, have you felt down, depressed or hopeless?: no Over the past 2 weeks, have you felt little interest or pleasure in doing things?:no   Gynecologic History No LMP recorded. Contraception: abstinence Last Pap: 06-24-2018. Results were: normal Last mammogram: n/a. Results were: n/a  Obstetric History OB History  Gravida Para Term Preterm AB Living  2 2 2  0 0 2  SAB TAB Ectopic Multiple Live Births  0 0 0 0 2    # Outcome Date GA Lbr Len/2nd Weight Sex Delivery Anes PTL Lv  2 Term 07/12/12 [redacted]w[redacted]d 03:06 / 00:12 6 lb 14.9 oz (3.144 kg) F Vag-Spont EPI  LIV     Birth Comments: WNL  1 Term 2011 [redacted]w[redacted]d 48:00 7 lb 6 oz (3.345 kg) M Vag-Spont EPI  LIV    Past Medical History:  Diagnosis Date  . No pertinent past medical history     Past Surgical History:  Procedure Laterality Date  . NO PAST SURGERIES       Current Outpatient Medications:  .  acetaminophen (TYLENOL) 500 MG tablet, Take 1 tablet (500 mg total) by mouth every 6 (six) hours as needed. (Patient not taking: Reported on 06/27/2019), Disp: 30 tablet, Rfl: 0 .  azithromycin (ZITHROMAX) 250 MG tablet, Take first 2 tablets together, then 1  every day until finished. (Patient not taking: Reported on 06/24/2018), Disp: 6 tablet, Rfl: 0 .  etonogestrel (NEXPLANON) 68 MG IMPL implant, 1 each by Subdermal route once., Disp: , Rfl:  .  ibuprofen (ADVIL,MOTRIN) 600 MG tablet, Take 1 tablet (600 mg total) by mouth every 6 (six) hours as needed. (Patient not taking: Reported on 06/27/2019), Disp: 30 tablet, Rfl: 0 .  methocarbamol (ROBAXIN) 500 MG tablet, Take 1 tablet (500 mg total) by mouth 2 (two) times daily. (Patient not taking: Reported on 06/27/2019), Disp: 20 tablet, Rfl: 0 .  sertraline (ZOLOFT) 50 MG tablet, Take 1 tablet (50 mg total) by mouth daily. Start with 1/2 pill (25mg ) at bedtime for the first seven days (Patient not taking: Reported on 06/27/2019), Disp: 30 tablet, Rfl: 1 No Known Allergies  Social History   Tobacco Use  . Smoking status: Never Smoker  . Smokeless tobacco: Never Used  Substance Use Topics  . Alcohol use: Yes    Alcohol/week: 0.0 standard drinks    Comment: occasionally    Family History  Problem Relation Age of Onset  . Healthy Mother   . Healthy Father   . Anesthesia problems Neg Hx   . Hypotension Neg Hx   . Malignant hyperthermia Neg Hx   . Pseudochol deficiency Neg Hx       Review of Systems  Constitutional: negative for fatigue and  weight loss Respiratory: negative for cough and wheezing Cardiovascular: negative for chest pain, fatigue and palpitations Gastrointestinal: negative for abdominal pain and change in bowel habits Musculoskeletal:negative for myalgias Neurological: negative for gait problems and tremors Behavioral/Psych: negative for abusive relationship, depression Endocrine: negative for temperature intolerance    Genitourinary:negative for abnormal menstrual periods, genital lesions, hot flashes, sexual problems and vaginal discharge Integument/breast: negative for breast lump, breast tenderness, nipple discharge and skin lesion(s)    Objective:       BP 113/60    Pulse 75   Wt 159 lb 9.6 oz (72.4 kg)   BMI 27.40 kg/m  General:   alert  Skin:   no rash or abnormalities  Lungs:   clear to auscultation bilaterally  Heart:   regular rate and rhythm, S1, S2 normal, no murmur, click, rub or gallop  Breasts:   normal without suspicious masses, skin or nipple changes or axillary nodes  Abdomen:  normal findings: no organomegaly, soft, non-tender and no hernia  Pelvis:  External genitalia: normal general appearance Urinary system: urethral meatus normal and bladder without fullness, nontender Vaginal: normal without tenderness, induration or masses Cervix: normal appearance Adnexa: normal bimanual exam Uterus: anteverted and non-tender, normal size   Lab Review Urine pregnancy test Labs reviewed yes Radiologic studies reviewed no  50% of 25 min visit spent on counseling and coordination of care.   Assessment:     1. Encounter for gynecological examination with Papanicolaou smear of cervix Rx: - Cytology - PAP( [facility])  2. Encounter for other general counseling and advice on contraception - declines contraception.  Not sexually active.   Plan:    Education reviewed: calcium supplements, depression evaluation, low fat, low cholesterol diet, safe sex/STD prevention, self breast exams and weight bearing exercise. Contraception: abstinence. Follow up in: 1 year.   No orders of the defined types were placed in this encounter.  No orders of the defined types were placed in this encounter.   Brock BadHARLES A. Oneda Duffett MD 06-27-2019

## 2019-06-28 LAB — CYTOLOGY - PAP: Diagnosis: NEGATIVE

## 2019-07-12 ENCOUNTER — Ambulatory Visit (HOSPITAL_COMMUNITY)
Admission: EM | Admit: 2019-07-12 | Discharge: 2019-07-12 | Disposition: A | Discharge status: Home / Self Care | Payer: Medicaid Other | Attending: Emergency Medicine | Admitting: Emergency Medicine

## 2019-07-12 ENCOUNTER — Other Ambulatory Visit: Payer: Self-pay

## 2019-07-12 ENCOUNTER — Encounter (HOSPITAL_COMMUNITY): Payer: Self-pay

## 2019-07-12 DIAGNOSIS — L03115 Cellulitis of right lower limb: Secondary | ICD-10-CM

## 2019-07-12 DIAGNOSIS — Z23 Encounter for immunization: Secondary | ICD-10-CM

## 2019-07-12 MED ORDER — TETANUS-DIPHTH-ACELL PERTUSSIS 5-2.5-18.5 LF-MCG/0.5 IM SUSP
0.5000 mL | Freq: Once | INTRAMUSCULAR | Status: AC
  Administered 2019-07-12: 0.5 mL via INTRAMUSCULAR

## 2019-07-12 MED ORDER — CEPHALEXIN 500 MG PO CAPS: 500.0000 mg | ORAL_CAPSULE | Freq: Four times a day (QID) | ORAL | 0 refills | Status: AC

## 2019-07-12 MED ORDER — TETANUS-DIPHTH-ACELL PERTUSSIS 5-2.5-18.5 LF-MCG/0.5 IM SUSP
INTRAMUSCULAR | Status: AC
  Filled 2019-07-12: qty 0.5

## 2019-07-12 MED ORDER — MUPIROCIN CALCIUM 2 % EX CREA: 1.0000 "application " | TOPICAL_CREAM | Freq: Two times a day (BID) | CUTANEOUS | 0 refills | Status: DC

## 2019-07-12 NOTE — ED Provider Notes (Signed)
[facility]    CSN: [id number] Arrival date & time: 07/12/19  1301     History   Chief Complaint Chief Complaint  Patient presents with  . Leg Pain  . Cellulitis    HPI Destiny Horn is a 28 y.o. female.   Patient presents with pain, redness, and swelling on her right lower leg x1 week.  She states that she was shaving her legs and cut herself; the area has become red swollen and tender.  She states her mother attempted treatment at home with an unknown capsule which she opened and sprinkled the yellow contents onto the wound; has not helped.  She denies fever, chills, drainage from the wound.  The history is provided by the patient.    Past Medical History:  Diagnosis Date  . No pertinent past medical history     Patient Active Problem List   Diagnosis Date Noted  . Postural dizziness 03/20/2014  . Developmental delay disorder 04/16/2012    Past Surgical History:  Procedure Laterality Date  . NO PAST SURGERIES      OB History    Gravida  2   Para  2   Term  2   Preterm  0   AB  0   Living  2     SAB  0   TAB  0   Ectopic  0   Multiple  0   Live Births  2            Home Medications    Prior to Admission medications   Medication Sig Start Date End Date Taking? Authorizing Provider  acetaminophen (TYLENOL) 500 MG tablet Take 1 tablet (500 mg total) by mouth every 6 (six) hours as needed. Patient not taking: Reported on 06/27/2019 02/08/19   Emi HolesLaw, Alexandra M, PA-C  azithromycin (ZITHROMAX) 250 MG tablet Take first 2 tablets together, then 1 every day until finished. Patient not taking: Reported on 06/24/2018 10/28/17   Linus MakoBurky, Natalie B, NP  cephALEXin (KEFLEX) 500 MG capsule Take 1 capsule (500 mg total) by mouth 4 (four) times daily for 10 days. 07/12/19 07/22/19  Mickie Bailate, Sylvan Lahm H, NP  etonogestrel (NEXPLANON) 68 MG IMPL implant 1 each by Subdermal route once.    [provider]  ibuprofen (ADVIL,MOTRIN) 600 MG tablet  Take 1 tablet (600 mg total) by mouth every 6 (six) hours as needed. Patient not taking: Reported on 06/27/2019 02/08/19   Emi HolesLaw, Alexandra M, PA-C  methocarbamol (ROBAXIN) 500 MG tablet Take 1 tablet (500 mg total) by mouth 2 (two) times daily. Patient not taking: Reported on 06/27/2019 02/08/19   Emi HolesLaw, Alexandra M, PA-C  mupirocin cream (BACTROBAN) 2 % Apply 1 application topically 2 (two) times daily. 07/12/19   Mickie Bailate, Kaidin Boehle H, NP  sertraline (ZOLOFT) 50 MG tablet Take 1 tablet (50 mg total) by mouth daily. Start with 1/2 pill (25mg ) at bedtime for the first seven days Patient not taking: Reported on 06/27/2019 07/27/18   Cain SaupeFulp, Cammie, MD    Family History Family History  Problem Relation Age of Onset  . Healthy Mother   . Healthy Father   . Anesthesia problems Neg Hx   . Hypotension Neg Hx   . Malignant hyperthermia Neg Hx   . Pseudochol deficiency Neg Hx     Social History Social History   Tobacco Use  . Smoking status: Never Smoker  . Smokeless tobacco: Never Used  Substance Use Topics  . Alcohol use: Yes  Alcohol/week: 0.0 standard drinks    Comment: occasionally  . Drug use: No     Allergies   Patient has no known allergies.   Review of Systems Review of Systems  Constitutional: Negative for chills and fever.  HENT: Negative for ear pain and sore throat.   Eyes: Negative for pain and visual disturbance.  Respiratory: Negative for cough and shortness of breath.   Cardiovascular: Negative for chest pain and palpitations.  Gastrointestinal: Negative for abdominal pain and vomiting.  Genitourinary: Negative for dysuria and hematuria.  Musculoskeletal: Negative for arthralgias and back pain.  Skin: Positive for wound. Negative for rash.  Neurological: Negative for seizures and syncope.  All other systems reviewed and are negative.    Physical Exam Triage Vital Signs ED Triage Vitals  Enc Vitals Group     BP 07/12/19 1311 112/65     Pulse Rate 07/12/19 1311 96      Resp 07/12/19 1311 17     Temp 07/12/19 1311 98.3 F (36.8 C)     Temp Source 07/12/19 1311 Oral     SpO2 07/12/19 1311 97 %     Weight --      Height --      Head Circumference --      Peak Flow --      Pain Score 07/12/19 1310 6     Pain Loc --      Pain Edu? --      Excl. in [place]? --    No data found.  Updated Vital Signs BP 112/65 (BP Location: Left Arm)   Pulse 96   Temp 98.3 F (36.8 C) (Oral)   Resp 17   SpO2 97%   Visual Acuity Right Eye Distance:   Left Eye Distance:   Bilateral Distance:    Right Eye Near:   Left Eye Near:    Bilateral Near:     Physical Exam Vitals signs and nursing note reviewed.  Constitutional:      General: She is not in acute distress.    Appearance: She is well-developed.  HENT:     Head: Normocephalic and atraumatic.  Eyes:     Conjunctiva/sclera: Conjunctivae normal.  Neck:     Musculoskeletal: Neck supple.  Cardiovascular:     Rate and Rhythm: Normal rate and regular rhythm.     Heart sounds: No murmur.  Pulmonary:     Effort: Pulmonary effort is normal. No respiratory distress.     Breath sounds: Normal breath sounds.  Abdominal:     Palpations: Abdomen is soft.     Tenderness: There is no abdominal tenderness.  Musculoskeletal: Normal range of motion.        General: Swelling present.  Skin:    General: Skin is warm and dry.     Findings: Erythema and lesion present.     Comments: RLE erythematous, edematous.  See picture for details.    Neurological:     General: No focal deficit present.     Mental Status: She is alert.     Sensory: No sensory deficit.     Motor: No weakness.        UC Treatments / Results  Labs (all labs ordered are listed, but only abnormal results are displayed) Labs Reviewed - No data to display  EKG   Radiology No results found.  Procedures Procedures (including critical care time)  Medications Ordered in UC Medications  Tdap (BOOSTRIX) injection 0.5 mL (has no  administration in time  range)  Tdap (BOOSTRIX) 5-2.5-18.5 LF-MCG/0.5 injection (has no administration in time range)    Initial Impression / Assessment and Plan / UC Course  I have reviewed the triage vital signs and the nursing notes.  Pertinent labs & imaging results that were available during my care of the patient were reviewed by me and considered in my medical decision making (see chart for details).   RLE cellulitis.  Treating with Keflex and Bactroban.  Wound care instructions and signs of infection discussed with patient.  Tetanus given today.  Instructed patient to come back here or go to the emergency department if she develops increased erythema, edema, has purulent drainage or fever, or other symptoms.  Instructed patient to follow-up with her PCP in 2 days for wound recheck.     Final Clinical Impressions(s) / UC Diagnoses   Final diagnoses:  Cellulitis of right lower extremity     Discharge Instructions     Keep your wound clean and dry.  Wash it with soap and water twice a day; then apply the antibiotic cream and a bandage.  Take the antibiotic pill as prescribed.    You were given a tetanus shot today.    Come back here or go to the emergency department if your redness or swelling gets worse, if your wound starts draining pus, or if you start having a fever or other symptoms.    Follow-up with your primary care provider in 2 days for a wound recheck.        ED Prescriptions    Medication Sig Dispense Auth. Provider   mupirocin cream (BACTROBAN) 2 % Apply 1 application topically 2 (two) times daily. 15 g Mickie Bailate, Shiza Thelen H, NP   cephALEXin (KEFLEX) 500 MG capsule Take 1 capsule (500 mg total) by mouth 4 (four) times daily for 10 days. 40 capsule Mickie Bailate, Cicely Ortner H, NP     Controlled Substance Prescriptions [place] Controlled Substance Registry consulted? Not Applicable   Mickie Bailate, Felissa Blouch H, NP 07/12/19 1346

## 2019-07-12 NOTE — ED Notes (Signed)
Unable to get all of unknown yellow substance out of pt's wound. Pt instructed to soak and clean wound when she gets home to get all of the yellow medicine out of the wound. Pt verbalized understanding and agreed not to put the yellow medicine back on the wound after cleansing and dressing.

## 2019-07-12 NOTE — ED Triage Notes (Signed)
Patient presents to Urgent Care with complaints of right ankle/lower leg pain and swelling since just over a week ago. Patient reports her mom opened up some yellow pills and sprinkled the contents on to the wound. Pt states it started after she cut herself while shaving and now the wound is larger and not healing well, large area of redness noted.

## 2019-07-12 NOTE — Discharge Instructions (Signed)
Keep your wound clean and dry.  Wash it with soap and water twice a day; then apply the antibiotic cream and a bandage.  Take the antibiotic pill as prescribed.    You were given a tetanus shot today.    Come back here or go to the emergency department if your redness or swelling gets worse, if your wound starts draining pus, or if you start having a fever or other symptoms.    Follow-up with your primary care provider in 2 days for a wound recheck.

## 2019-12-05 ENCOUNTER — Telehealth (INDEPENDENT_AMBULATORY_CARE_PROVIDER_SITE_OTHER): Payer: Medicaid Other | Admitting: Obstetrics

## 2019-12-05 ENCOUNTER — Ambulatory Visit: Payer: Medicaid Other | Admitting: Obstetrics

## 2019-12-05 ENCOUNTER — Encounter: Payer: Self-pay | Admitting: Obstetrics

## 2019-12-05 DIAGNOSIS — Z3009 Encounter for other general counseling and advice on contraception: Secondary | ICD-10-CM

## 2019-12-05 NOTE — Progress Notes (Signed)
   TELEHEALTH VIRTUAL GYNECOLOGY VISIT ENCOUNTER NOTE  I connected with Destiny Horn on 12/05/19 at 11:15 AM EST by telephone at home and verified that I am speaking with the correct person using two identifiers.   I discussed the limitations, risks, security and privacy concerns of performing an evaluation and management service by telephone and the availability of in person appointments. I also discussed with the patient that there may be a patient responsible charge related to this service. The patient expressed understanding and agreed to proceed.   History:  Destiny Horn is a 29 y.o. [phone] female being evaluated today for contraceptive counseling and advice. She denies any abnormal vaginal discharge, bleeding, pelvic pain or other concerns.       Past Medical History:  Diagnosis Date  . No pertinent past medical history    Past Surgical History:  Procedure Laterality Date  . NO PAST SURGERIES     The following portions of the patient's history were reviewed and updated as appropriate: allergies, current medications, past family history, past medical history, past social history, past surgical history and problem list.   Health Maintenance:  Normal pap and negative HRHPV on [id number].   Review of Systems:  Pertinent items noted in HPI and remainder of comprehensive ROS otherwise negative.  Physical Exam:   General:  Alert, oriented and cooperative.   Mental Status: Normal mood and affect perceived. Normal judgment and thought content.  Physical exam deferred due to nature of the encounter  Labs and Imaging No results found for this or any previous visit (from the past 336 hour(s)). No results found.    Assessment and Plan:     1. Encounter for other general counseling and advice on contraception - wants ParaGuard IUD       I discussed the assessment and treatment plan with the patient. The patient was provided an opportunity to ask questions and all were  answered. The patient agreed with the plan and demonstrated an understanding of the instructions.   The patient was advised to call back or seek an in-person evaluation/go to the ED if the symptoms worsen or if the condition fails to improve as anticipated.  I provided 15 minutes of non-face-to-face time during this encounter.   Coral Ceo, MD Center for [facility], [facility] 12/05/2019

## 2019-12-05 NOTE — Progress Notes (Signed)
Pt states she is interested in Nexplanon.

## 2019-12-26 ENCOUNTER — Ambulatory Visit (INDEPENDENT_AMBULATORY_CARE_PROVIDER_SITE_OTHER): Payer: Medicaid Other | Admitting: Obstetrics

## 2019-12-26 ENCOUNTER — Other Ambulatory Visit: Payer: Self-pay

## 2019-12-26 ENCOUNTER — Encounter: Payer: Self-pay | Admitting: Obstetrics

## 2019-12-26 VITALS — Wt 158.0 lb

## 2019-12-26 DIAGNOSIS — Z3202 Encounter for pregnancy test, result negative: Secondary | ICD-10-CM | POA: Diagnosis not present

## 2019-12-26 DIAGNOSIS — Z30430 Encounter for insertion of intrauterine contraceptive device: Secondary | ICD-10-CM

## 2019-12-26 LAB — POCT URINE PREGNANCY: Preg Test, Ur: NEGATIVE

## 2019-12-26 MED ORDER — PARAGARD INTRAUTERINE COPPER IU IUD: INTRAUTERINE_SYSTEM | Freq: Once | INTRAUTERINE | Status: AC

## 2019-12-26 NOTE — Progress Notes (Signed)
IUD Insertion Procedure Note  Pre-operative Diagnosis: Desire Long Acting Reversible Contraception ( LARC )  Post-operative Diagnosis: same  Indications: contraception  Procedure Details  Urine pregnancy test was done in office and result was negative.  The risks (including infection, bleeding, pain, and uterine perforation) and benefits of the procedure were explained to the patient and Written informed consent was obtained.    Cervix cleansed with Betadine. Uterus sounded to 7 cm. IUD inserted without difficulty. String visible and trimmed. Patient tolerated procedure well.  IUD Information: ParaGard. Lot #  [id number] Exp. Date:  OCT 2026  Condition: Stable  Complications: None  Plan:  The patient was advised to call for any fever or for prolonged or severe pain or bleeding. She was advised to use NSAID as needed for mild to moderate pain.   Attending Physician Documentation: I was present for or participated in the entire procedure, including opening and closing.   Brock Bad, MD 12/26/2019 11:27 AM

## 2020-02-20 ENCOUNTER — Other Ambulatory Visit: Payer: Self-pay

## 2020-02-20 ENCOUNTER — Ambulatory Visit: Payer: Medicaid Other | Admitting: Obstetrics

## 2020-02-20 ENCOUNTER — Encounter: Payer: Self-pay | Admitting: Obstetrics

## 2020-02-20 ENCOUNTER — Ambulatory Visit (INDEPENDENT_AMBULATORY_CARE_PROVIDER_SITE_OTHER): Payer: Medicaid Other | Admitting: Obstetrics

## 2020-02-20 VITALS — Wt 153.0 lb

## 2020-02-20 DIAGNOSIS — Z30431 Encounter for routine checking of intrauterine contraceptive device: Secondary | ICD-10-CM

## 2020-02-20 NOTE — Progress Notes (Signed)
Subjective:    Destiny Horn is a 29 y.o. female who presents for contraception counseling. The patient has no complaints today. The patient is sexually active. Pertinent past medical history: none.  The information documented in the HPI was reviewed and verified.  Menstrual History: OB History    Gravida  2   Para  2   Term  2   Preterm  0   AB  0   Living  2     SAB  0   TAB  0   Ectopic  0   Multiple  0   Live Births  2            No LMP recorded.   Patient Active Problem List   Diagnosis Date Noted  . Postural dizziness 03/20/2014  . Developmental delay disorder 04/16/2012   Past Medical History:  Diagnosis Date  . No pertinent past medical history     Past Surgical History:  Procedure Laterality Date  . NO PAST SURGERIES       Current Outpatient Medications:  .  acetaminophen (TYLENOL) 500 MG tablet, Take 1 tablet (500 mg total) by mouth every 6 (six) hours as needed. (Patient not taking: Reported on 06/27/2019), Disp: 30 tablet, Rfl: 0 .  ibuprofen (ADVIL,MOTRIN) 600 MG tablet, Take 1 tablet (600 mg total) by mouth every 6 (six) hours as needed. (Patient not taking: Reported on 06/27/2019), Disp: 30 tablet, Rfl: 0 .  methocarbamol (ROBAXIN) 500 MG tablet, Take 1 tablet (500 mg total) by mouth 2 (two) times daily. (Patient not taking: Reported on 06/27/2019), Disp: 20 tablet, Rfl: 0 .  mupirocin cream (BACTROBAN) 2 %, Apply 1 application topically 2 (two) times daily. (Patient not taking: Reported on 12/05/2019), Disp: 15 g, Rfl: 0 .  sertraline (ZOLOFT) 50 MG tablet, Take 1 tablet (50 mg total) by mouth daily. Start with 1/2 pill (25mg ) at bedtime for the first seven days (Patient not taking: Reported on 06/27/2019), Disp: 30 tablet, Rfl: 1  Current Facility-Administered Medications:  .  paragard intrauterine copper IUD, , Intrauterine, Once, Shelly Bombard, MD No Known Allergies  Social History   Tobacco Use  . Smoking status: Never Smoker   . Smokeless tobacco: Never Used  Substance Use Topics  . Alcohol use: Yes    Alcohol/week: 0.0 standard drinks    Comment: occasionally    Family History  Problem Relation Age of Onset  . Healthy Mother   . Healthy Father   . Anesthesia problems Neg Hx   . Hypotension Neg Hx   . Malignant hyperthermia Neg Hx   . Pseudochol deficiency Neg Hx        Review of Systems Constitutional: negative for weight loss Genitourinary:negative for abnormal menstrual periods and vaginal discharge   Objective:   Wt 153 lb (69.4 kg)   BMI 26.26 kg/m           General:  Alert and no distress Abdomen:  normal findings: no organomegaly, soft, non-tender and no hernia  Pelvis:  External genitalia: normal general appearance Urinary system: urethral meatus normal and bladder without fullness, nontender Vaginal: normal without tenderness, induration or masses Cervix: normal appearance Adnexa: normal bimanual exam Uterus: anteverted and non-tender, normal size   Lab Review Urine pregnancy test Labs reviewed no Radiologic studies reviewed no  50% of 15 min visit spent on counseling and coordination of care.    Assessment:    29 y.o., continuing IUD, no contraindications.   Plan:  All questions answered. Contraception: IUD. Discussed healthy lifestyle modifications. Follow up in 4 months.    No orders of the defined types were placed in this encounter.  No orders of the defined types were placed in this encounter.  Brock Bad, MD 02/20/2020 3:34 PM

## 2020-04-01 ENCOUNTER — Other Ambulatory Visit: Payer: Self-pay

## 2020-04-01 ENCOUNTER — Ambulatory Visit (HOSPITAL_COMMUNITY)
Admission: EM | Admit: 2020-04-01 | Discharge: 2020-04-01 | Disposition: A | Discharge status: Home / Self Care | Payer: Medicaid Other | Attending: Family Medicine | Admitting: Family Medicine

## 2020-04-01 ENCOUNTER — Encounter (HOSPITAL_COMMUNITY): Payer: Self-pay

## 2020-04-01 DIAGNOSIS — R05 Cough: Secondary | ICD-10-CM | POA: Diagnosis present

## 2020-04-01 DIAGNOSIS — Z20822 Contact with and (suspected) exposure to covid-19: Secondary | ICD-10-CM | POA: Insufficient documentation

## 2020-04-01 DIAGNOSIS — H60503 Unspecified acute noninfective otitis externa, bilateral: Secondary | ICD-10-CM | POA: Diagnosis not present

## 2020-04-01 DIAGNOSIS — J069 Acute upper respiratory infection, unspecified: Secondary | ICD-10-CM | POA: Insufficient documentation

## 2020-04-01 LAB — SARS CORONAVIRUS 2 (TAT 6-24 HRS): SARS Coronavirus 2: NEGATIVE

## 2020-04-01 MED ORDER — PSEUDOEPH-BROMPHEN-DM 30-2-10 MG/5ML PO SYRP: 5.0000 mL | ORAL_SOLUTION | Freq: Four times a day (QID) | ORAL | 0 refills | Status: DC | PRN

## 2020-04-01 MED ORDER — NEOMYCIN-POLYMYXIN-HC 3.5-10000-1 OT SUSP: 3.0000 [drp] | Freq: Three times a day (TID) | OTIC | 0 refills | Status: AC

## 2020-04-01 NOTE — ED Triage Notes (Signed)
Pt presents with non productive cough, nasal drainage, congestion, and bilateral ear pain since yesterday.

## 2020-04-01 NOTE — ED Provider Notes (Addendum)
[facility]    CSN: [id number] Arrival date & time: 04/01/20  1006      History   Chief Complaint Chief Complaint  Patient presents with  . Cough  . Otalgia  . Nasal Drainage  . Congestion    HPI Destiny Horn is a 29 y.o. female.   HPI Patient present for evaluation of URI symptoms. Nasal congestion present x 1 week. Yesterday developed ear pain, sore throat, headache. Denies fever. Denies known exposure to anyone with COVID-19. She has received 1/2 of her COVID-19  vaccines.  Reports upon awakening this morning both of her ears are painful right greater than left.  Denies any drainage from her ear or history of recurrent ear infections.  Does not use Q-tips in the ears.  She does not recently recall having any water contact with her inner ear.  She has not attempted relief with any over-the-counter medications.  She is afebrile here in clinic today. Past Medical History:  Diagnosis Date  . No pertinent past medical history     Patient Active Problem List   Diagnosis Date Noted  . Postural dizziness 03/20/2014  . Developmental delay disorder 04/16/2012    Past Surgical History:  Procedure Laterality Date  . NO PAST SURGERIES      OB History    Gravida  2   Para  2   Term  2   Preterm  0   AB  0   Living  2     SAB  0   TAB  0   Ectopic  0   Multiple  0   Live Births  2            Home Medications    Prior to Admission medications   Medication Sig Start Date End Date Taking? Authorizing Provider  acetaminophen (TYLENOL) 500 MG tablet Take 1 tablet (500 mg total) by mouth every 6 (six) hours as needed. Patient not taking: Reported on 06/27/2019 02/08/19   Emi Holes, PA-C  ibuprofen (ADVIL,MOTRIN) 600 MG tablet Take 1 tablet (600 mg total) by mouth every 6 (six) hours as needed. Patient not taking: Reported on 06/27/2019 02/08/19   Emi Holes, PA-C  methocarbamol (ROBAXIN) 500 MG tablet Take 1 tablet (500 mg total)  by mouth 2 (two) times daily. Patient not taking: Reported on 06/27/2019 02/08/19   Emi Holes, PA-C  mupirocin cream (BACTROBAN) 2 % Apply 1 application topically 2 (two) times daily. Patient not taking: Reported on 12/05/2019 07/12/19   Mickie Bail, NP  sertraline (ZOLOFT) 50 MG tablet Take 1 tablet (50 mg total) by mouth daily. Start with 1/2 pill (25mg ) at bedtime for the first seven days Patient not taking: Reported on 06/27/2019 07/27/18   07/29/18, MD    Family History Family History  Problem Relation Age of Onset  . Healthy Mother   . Healthy Father   . Anesthesia problems Neg Hx   . Hypotension Neg Hx   . Malignant hyperthermia Neg Hx   . Pseudochol deficiency Neg Hx     Social History Social History   Tobacco Use  . Smoking status: Never Smoker  . Smokeless tobacco: Never Used  Substance Use Topics  . Alcohol use: Yes    Alcohol/week: 0.0 standard drinks    Comment: occasionally  . Drug use: No     Allergies   Patient has no known allergies.   Review of Systems Review of Systems Pertinent negatives  listed in HPI  Physical Exam Triage Vital Signs ED Triage Vitals  Enc Vitals Group     BP 04/01/20 1026 109/75     Pulse Rate 04/01/20 1026 78     Resp 04/01/20 1026 17     Temp 04/01/20 1026 97.9 F (36.6 C)     Temp Source 04/01/20 1026 Oral     SpO2 04/01/20 1026 98 %     Weight --      Height --      Head Circumference --      Peak Flow --      Pain Score 04/01/20 1029 4     Pain Loc --      Pain Edu? --      Excl. in [place]? --    No data found.  Updated Vital Signs BP 109/75 (BP Location: Right Arm)   Pulse 78   Temp 97.9 F (36.6 C) (Oral)   Resp 17   LMP 03/24/2020   SpO2 98%   Visual Acuity Right Eye Distance:   Left Eye Distance:   Bilateral Distance:    Right Eye Near:   Left Eye Near:    Bilateral Near:     Physical Exam Constitutional:      Appearance: Normal appearance.  HENT:     Right Ear: Hearing normal.  Swelling and tenderness present. There is mastoid tenderness. Tympanic membrane is not perforated.     Left Ear: Hearing normal. Tenderness present. No mastoid tenderness. Tympanic membrane is not perforated.  Cardiovascular:     Rate and Rhythm: Normal rate and regular rhythm.  Pulmonary:     Effort: Pulmonary effort is normal.     Breath sounds: Normal breath sounds.  Neurological:     Mental Status: She is alert.  Psychiatric:        Attention and Perception: Attention normal.        Mood and Affect: Mood normal.        Speech: Speech normal.     UC Treatments / Results  Labs (all labs ordered are listed, but only abnormal results are displayed) Labs Reviewed  SARS CORONAVIRUS 2 (TAT 6-24 HRS)    EKG   Radiology No results found.  Procedures Procedures (including critical care time)  Medications Ordered in UC Medications - No data to display  Initial Impression / Assessment and Plan / UC Course  I have reviewed the triage vital signs and the nursing notes.  Pertinent labs & imaging results that were available during my care of the patient were reviewed by me and considered in my medical decision making (see chart for details).     Acute otitis externa of both ears, unspecified type -Cortisporin 3 drops 3 times daily x5 days  -Viral URI with cough -COVID-19 test pending -Bromfed 5 mL 4 times daily as needed for cough and nasal congestion  Follow-up if symptoms do not improve.  Final Clinical Impressions(s) / UC Diagnoses   Final diagnoses:  Acute otitis externa of both ears, unspecified type  Viral URI with cough   Discharge Instructions   None    ED Prescriptions    Medication Sig Dispense Auth. Provider   brompheniramine-pseudoephedrine-DM 30-2-10 MG/5ML syrup Take 5 mLs by mouth 4 (four) times daily as needed. 140 mL Scot Jun, FNP   neomycin-polymyxin-hydrocortisone (CORTISPORIN) 3.5-10000-1 OTIC suspension Place 3 drops into both ears 3  (three) times daily for 5 days. 2.3 mL Scot Jun, FNP  PDMP not reviewed this encounter.   Bing Neighbors, FNP 04/01/20 1126    Bing Neighbors, [place] 04/01/20 [phone]

## 2020-04-23 ENCOUNTER — Ambulatory Visit: Payer: Medicaid Other | Admitting: Obstetrics

## 2020-05-07 ENCOUNTER — Ambulatory Visit (INDEPENDENT_AMBULATORY_CARE_PROVIDER_SITE_OTHER): Payer: Medicaid Other | Admitting: Obstetrics

## 2020-05-07 ENCOUNTER — Other Ambulatory Visit: Payer: Self-pay

## 2020-05-07 ENCOUNTER — Encounter: Payer: Self-pay | Admitting: Obstetrics

## 2020-05-07 VITALS — BP 102/69 | HR 69 | Ht 60.0 in | Wt 154.0 lb

## 2020-05-07 DIAGNOSIS — N946 Dysmenorrhea, unspecified: Secondary | ICD-10-CM

## 2020-05-07 DIAGNOSIS — R102 Pelvic and perineal pain: Secondary | ICD-10-CM

## 2020-05-07 DIAGNOSIS — Z30431 Encounter for routine checking of intrauterine contraceptive device: Secondary | ICD-10-CM | POA: Diagnosis not present

## 2020-05-07 MED ORDER — IBUPROFEN 800 MG PO TABS: 800.0000 mg | ORAL_TABLET | Freq: Three times a day (TID) | ORAL | 5 refills | Status: DC | PRN

## 2020-05-07 NOTE — Progress Notes (Signed)
Patient ID: Destiny Horn, female   DOB: Apr 08, 1991, 29 y.o.   MRN: [id number]  Chief Complaint  Patient presents with  . IUD check    HPI Destiny Horn is a 29 y.o. female.  Patient has a ParaGuard IUD that was inserted in Jan. 2021.  She states that she feels that her IUD is moving from side to side during her period.  Her periods are heavy 3-4 day cycles. HPI  Past Medical History:  Diagnosis Date  . No pertinent past medical history     Past Surgical History:  Procedure Laterality Date  . NO PAST SURGERIES      Family History  Problem Relation Age of Onset  . Healthy Mother   . Healthy Father   . Anesthesia problems Neg Hx   . Hypotension Neg Hx   . Malignant hyperthermia Neg Hx   . Pseudochol deficiency Neg Hx     Social History Social History   Tobacco Use  . Smoking status: Never Smoker  . Smokeless tobacco: Never Used  Substance Use Topics  . Alcohol use: Yes    Alcohol/week: 0.0 standard drinks    Comment: occasionally  . Drug use: No    No Known Allergies  Current Outpatient Medications  Medication Sig Dispense Refill  . acetaminophen (TYLENOL) 500 MG tablet Take 1 tablet (500 mg total) by mouth every 6 (six) hours as needed. (Patient not taking: Reported on 06/27/2019) 30 tablet 0  . brompheniramine-pseudoephedrine-DM 30-2-10 MG/5ML syrup Take 5 mLs by mouth 4 (four) times daily as needed. (Patient not taking: Reported on 05/07/2020) 140 mL 0  . ibuprofen (ADVIL) 800 MG tablet Take 1 tablet (800 mg total) by mouth every 8 (eight) hours as needed. 30 tablet 5  . ibuprofen (ADVIL,MOTRIN) 600 MG tablet Take 1 tablet (600 mg total) by mouth every 6 (six) hours as needed. (Patient not taking: Reported on 06/27/2019) 30 tablet 0  . methocarbamol (ROBAXIN) 500 MG tablet Take 1 tablet (500 mg total) by mouth 2 (two) times daily. (Patient not taking: Reported on 06/27/2019) 20 tablet 0  . mupirocin cream (BACTROBAN) 2 % Apply 1 application topically 2 (two)  times daily. (Patient not taking: Reported on 12/05/2019) 15 g 0  . sertraline (ZOLOFT) 50 MG tablet Take 1 tablet (50 mg total) by mouth daily. Start with 1/2 pill (25mg ) at bedtime for the first seven days (Patient not taking: Reported on 06/27/2019) 30 tablet 1   Current Facility-Administered Medications  Medication Dose Route Frequency Provider Last Rate Last Admin  . paragard intrauterine copper IUD   Intrauterine Once 06/29/2019, MD        Review of Systems Review of Systems Constitutional: negative for fatigue and weight loss Respiratory: negative for cough and wheezing Cardiovascular: negative for chest pain, fatigue and palpitations Gastrointestinal: negative for abdominal pain and change in bowel habits Genitourinary:positive for IUD discomfort during menstrual period Integument/breast: negative for nipple discharge Musculoskeletal:negative for myalgias Neurological: negative for gait problems and tremors Behavioral/Psych: negative for abusive relationship, depression Endocrine: negative for temperature intolerance      Blood pressure 102/69, pulse 69, height 5' (1.524 m), weight 154 lb (69.9 kg).  Physical Exam Physical Exam:          General:  Alert and no distress Abdomen:  normal findings: no organomegaly, soft, non-tender and no hernia  Pelvis:  External genitalia: normal general appearance Urinary system: urethral meatus normal and bladder without fullness, nontender Vaginal: normal without tenderness,  induration or masses Cervix: normal appearance Adnexa: normal bimanual exam Uterus: anteverted and non-tender, normal size    50% of 15 min visit spent on counseling and coordination of care.   Data Reviewed Wet Prep Notes from IUD Insertion  Assessment       1. IUD check up Rx: - US PELVIC COMPLETE WITH TRANSVAGINAL; Future  2. Pelvic pain Rx: - US PELVIC COMPLETE WITH TRANSVAGINAL; Future - ibuprofen (ADVIL) 800 MG tablet; Take 1 tablet (800 mg  total) by mouth every 8 (eight) hours as needed.  Dispense: 30 tablet; Refill: 5  3. Dysmenorrhea - Ibuprofen prn  Plan    Orders Placed This Encounter  Procedures  . US PELVIC COMPLETE WITH TRANSVAGINAL    Standing Status:   Future    Standing Expiration Date:   05/07/2021    Order Specific Question:   Reason for Exam (SYMPTOM  OR DIAGNOSIS REQUIRED)    Answer:   pelvic pain    Order Specific Question:   Preferred imaging location?    Answer:   [facility]   Meds ordered this encounter  Medications  . ibuprofen (ADVIL) 800 MG tablet    Sig: Take 1 tablet (800 mg total) by mouth every 8 (eight) hours as needed.    Dispense:  30 tablet    Refill:  5     Shelly Bombard, MD 05/07/2020 1:56 PM

## 2020-05-07 NOTE — Progress Notes (Signed)
Pt is in the office for IUD check, inserted 12-26-2019. Pt states that she can feel IUD moving from side-to-side.

## 2020-05-14 ENCOUNTER — Other Ambulatory Visit: Payer: Self-pay

## 2020-05-14 ENCOUNTER — Ambulatory Visit
Admission: RE | Admit: 2020-05-14 | Discharge: 2020-05-14 | Disposition: A | Discharge status: Home / Self Care | Payer: Medicaid Other | Source: Ambulatory Visit | Attending: Obstetrics | Admitting: Obstetrics

## 2020-05-14 DIAGNOSIS — Z30431 Encounter for routine checking of intrauterine contraceptive device: Secondary | ICD-10-CM | POA: Insufficient documentation

## 2020-05-14 DIAGNOSIS — R102 Pelvic and perineal pain: Secondary | ICD-10-CM | POA: Insufficient documentation

## 2020-05-21 ENCOUNTER — Encounter: Payer: Self-pay | Admitting: Obstetrics

## 2020-05-21 ENCOUNTER — Telehealth (INDEPENDENT_AMBULATORY_CARE_PROVIDER_SITE_OTHER): Payer: Medicaid Other | Admitting: Obstetrics

## 2020-05-21 DIAGNOSIS — Z30431 Encounter for routine checking of intrauterine contraceptive device: Secondary | ICD-10-CM

## 2020-05-21 NOTE — Progress Notes (Signed)
S/w pt for virtual visit to discuss u/s results from 05-14-20.

## 2020-05-21 NOTE — Progress Notes (Signed)
GYNECOLOGY VIRTUAL VISIT ENCOUNTER NOTE  Provider location: [facility]   I connected with [name] on 05/21/20 at  1:15 PM EDT by MyChart Video Encounter at home and verified that I am speaking with the correct person using two identifiers.   I discussed the limitations, risks, security and privacy concerns of performing an evaluation and management service virtually and the availability of in person appointments. I also discussed with the patient that there may be a patient responsible charge related to this service. The patient expressed understanding and agreed to proceed.   History:  Destiny Horn is a 29 y.o. [phone] female being evaluated today for results of ultrasound done for IUD position. She denies any abnormal vaginal discharge, bleeding, pelvic pain or other concerns.       Past Medical History:  Diagnosis Date  . No pertinent past medical history    Past Surgical History:  Procedure Laterality Date  . NO PAST SURGERIES     The following portions of the patient's history were reviewed and updated as appropriate: allergies, current medications, past family history, past medical history, past social history, past surgical history and problem list.   Health Maintenance:  Normal pap and negative HRHPV on 06-27-2019.    Review of Systems:  Pertinent items noted in HPI and remainder of comprehensive ROS otherwise negative.  Physical Exam:   General:  Alert, oriented and cooperative. Patient appears to be in no acute distress.  Mental Status: Normal mood and affect. Normal behavior. Normal judgment and thought content.   Respiratory: Normal respiratory effort, no problems with respiration noted  Rest of physical exam deferred due to type of encounter  Labs and Imaging  US PELVIC COMPLETE WITH TRANSVAGINAL  Result Date: 05/14/2020 CLINICAL DATA:  Pelvic pain.  IUD. EXAM: TRANSABDOMINAL AND TRANSVAGINAL ULTRASOUND OF PELVIS  TECHNIQUE: Both transabdominal and transvaginal ultrasound examinations of the pelvis were performed. Transabdominal technique was performed for global imaging of the pelvis including uterus, ovaries, adnexal regions, and pelvic cul-de-sac. It was necessary to proceed with endovaginal exam following the transabdominal exam to visualize the IUD and ovaries. COMPARISON:  None FINDINGS: Uterus Measurements: 8.9 x 4.6 x 5.2 cm = volume: 107 mL. Heterogeneous echogenicity of myometrium noted, however no distinct fibroids or other mass visualized. Endometrium Thickness: 14 mm. IUD seen in expected position in endometrial cavity. Right ovary Measurements: 3.4 x 2.0 x 2.6 cm = volume: 9.2 mL. Normal appearance/no adnexal mass. Left ovary Measurements: 3.1 x 2.4 x 2.7 cm = volume: 10.3 mL. Normal appearance/no adnexal mass. Other findings No abnormal free fluid. IMPRESSION: IUD in expected position in endometrial cavity. Diffusely heterogeneous uterine myometrium, suspicious for adenomyosis. No fibroids identified. Normal appearance of both ovaries. Electronically Signed   By: Danae Orleans M.D.   On: 05/14/2020 11:51       Assessment and Plan:     1. IUD check up for IUD position - IUD in normal anatomic position in uterus per ultrasound - follow up prn       I discussed the assessment and treatment plan with the patient. The patient was provided an opportunity to ask questions and all were answered. The patient agreed with the plan and demonstrated an understanding of the instructions.   The patient was advised to call back or seek an in-person evaluation/go to the ED if the symptoms worsen or if the condition fails to improve as anticipated.  I provided 15 minutes of  face-to-face time during this encounter.   Baltazar Najjar, MD [facility] 05/21/20

## 2020-07-05 ENCOUNTER — Ambulatory Visit: Payer: Medicaid Other | Admitting: Obstetrics

## 2020-07-16 ENCOUNTER — Ambulatory Visit: Payer: Medicaid Other | Admitting: Obstetrics

## 2020-07-30 ENCOUNTER — Ambulatory Visit: Payer: Medicaid Other | Admitting: Obstetrics
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
## 2021-08-06 ENCOUNTER — Ambulatory Visit: Payer: Medicaid Other | Admitting: Obstetrics & Gynecology

## 2021-08-19 ENCOUNTER — Ambulatory Visit (INDEPENDENT_AMBULATORY_CARE_PROVIDER_SITE_OTHER): Payer: Medicaid Other | Admitting: Obstetrics

## 2021-08-19 ENCOUNTER — Encounter: Payer: Self-pay | Admitting: Obstetrics

## 2021-08-19 ENCOUNTER — Other Ambulatory Visit: Payer: Self-pay

## 2021-08-19 ENCOUNTER — Other Ambulatory Visit (HOSPITAL_COMMUNITY)
Admission: RE | Admit: 2021-08-19 | Discharge: 2021-08-19 | Disposition: A | Discharge status: Home / Self Care | Payer: Medicaid Other | Source: Ambulatory Visit | Attending: Obstetrics | Admitting: Obstetrics

## 2021-08-19 VITALS — BP 118/66 | HR 82 | Ht 60.0 in | Wt 154.6 lb

## 2021-08-19 DIAGNOSIS — N898 Other specified noninflammatory disorders of vagina: Secondary | ICD-10-CM

## 2021-08-19 DIAGNOSIS — Z01419 Encounter for gynecological examination (general) (routine) without abnormal findings: Secondary | ICD-10-CM | POA: Insufficient documentation

## 2021-08-19 DIAGNOSIS — Z30431 Encounter for routine checking of intrauterine contraceptive device: Secondary | ICD-10-CM

## 2021-08-19 NOTE — Progress Notes (Signed)
Annual PAP, STI swab testing No vaginal discharge or irritation Flu shot offered and declined

## 2021-08-19 NOTE — Progress Notes (Signed)
Subjective:        Destiny Horn is a 30 y.o. female here for a routine exam.  Current complaints: Vaginal discharge.    Personal health questionnaire:  Is patient Ashkenazi Jewish, have a family history of breast and/or ovarian cancer: no Is there a family history of uterine cancer diagnosed at age < 9, gastrointestinal cancer, urinary tract cancer, family member who is a Personnel officer syndrome-associated carrier: no Is the patient overweight and hypertensive, family history of diabetes, personal history of gestational diabetes, preeclampsia or PCOS: no Is patient over 76, have PCOS,  family history of premature CHD under age 15, diabetes, smoke, have hypertension or peripheral artery disease:  no At any time, has a partner hit, kicked or otherwise hurt or frightened you?: no Over the past 2 weeks, have you felt down, depressed or hopeless?: no Over the past 2 weeks, have you felt little interest or pleasure in doing things?:no   Gynecologic History Patient's last menstrual period was 07/29/2021 (approximate). Contraception: IUD Last Pap: 2022. Results were: normal Last mammogram: n/a. Results were: n/a  Obstetric History OB History  Gravida Para Term Preterm AB Living  2 2 2  0 0 2  SAB IAB Ectopic Multiple Live Births  0 0 0 0 2    # Outcome Date GA Lbr Len/2nd Weight Sex Delivery Anes PTL Lv  2 Term 07/12/12 [redacted]w[redacted]d 03:06 / 00:12 6 lb 14.9 oz (3.144 kg) F Vag-Spont EPI  LIV     Birth Comments: WNL  1 Term 2011 [redacted]w[redacted]d 48:00 7 lb 6 oz (3.345 kg) M Vag-Spont EPI  LIV    Past Medical History:  Diagnosis Date   No pertinent past medical history     Past Surgical History:  Procedure Laterality Date   NO PAST SURGERIES       Current Outpatient Medications:    acetaminophen (TYLENOL) 500 MG tablet, Take 1 tablet (500 mg total) by mouth every 6 (six) hours as needed. (Patient not taking: No sig reported), Disp: 30 tablet, Rfl: 0   brompheniramine-pseudoephedrine-DM 30-2-10  MG/5ML syrup, Take 5 mLs by mouth 4 (four) times daily as needed. (Patient not taking: No sig reported), Disp: 140 mL, Rfl: 0   ibuprofen (ADVIL) 800 MG tablet, Take 1 tablet (800 mg total) by mouth every 8 (eight) hours as needed. (Patient not taking: Reported on 08/19/2021), Disp: 30 tablet, Rfl: 5   ibuprofen (ADVIL,MOTRIN) 600 MG tablet, Take 1 tablet (600 mg total) by mouth every 6 (six) hours as needed. (Patient not taking: No sig reported), Disp: 30 tablet, Rfl: 0   methocarbamol (ROBAXIN) 500 MG tablet, Take 1 tablet (500 mg total) by mouth 2 (two) times daily. (Patient not taking: No sig reported), Disp: 20 tablet, Rfl: 0   mupirocin cream (BACTROBAN) 2 %, Apply 1 application topically 2 (two) times daily. (Patient not taking: No sig reported), Disp: 15 g, Rfl: 0   sertraline (ZOLOFT) 50 MG tablet, Take 1 tablet (50 mg total) by mouth daily. Start with 1/2 pill (25mg ) at bedtime for the first seven days (Patient not taking: No sig reported), Disp: 30 tablet, Rfl: 1  Current Facility-Administered Medications:    paragard intrauterine copper IUD, , Intrauterine, Once, 08/21/2021, MD No Known Allergies  Social History   Tobacco Use   Smoking status: Never   Smokeless tobacco: Never  Substance Use Topics   Alcohol use: Yes    Alcohol/week: 0.0 standard drinks    Comment: occasionally  Family History  Problem Relation Age of Onset   Healthy Mother    Healthy Father    Anesthesia problems Neg Hx    Hypotension Neg Hx    Malignant hyperthermia Neg Hx    Pseudochol deficiency Neg Hx       Review of Systems  Constitutional: negative for fatigue and weight loss Respiratory: negative for cough and wheezing Cardiovascular: negative for chest pain, fatigue and palpitations Gastrointestinal: negative for abdominal pain and change in bowel habits Musculoskeletal:negative for myalgias Neurological: negative for gait problems and tremors Behavioral/Psych: negative for abusive  relationship, depression Endocrine: negative for temperature intolerance    Genitourinary:negative for abnormal menstrual periods, genital lesions, hot flashes, sexual problems and vaginal discharge Integument/breast: negative for breast lump, breast tenderness, nipple discharge and skin lesion(s)    Objective:       BP 118/66   Pulse 82   Ht 5' (1.524 m)   Wt 154 lb 9.6 oz (70.1 kg)   LMP 07/29/2021 (Approximate)   BMI 30.19 kg/m  General:   Alert and no distress  Skin:   no rash or abnormalities  Lungs:   clear to auscultation bilaterally  Heart:   regular rate and rhythm, S1, S2 normal, no murmur, click, rub or gallop  Breasts:   normal without suspicious masses, skin or nipple changes or axillary nodes  Abdomen:  normal findings: no organomegaly, soft, non-tender and no hernia  Pelvis:  External genitalia: normal general appearance Urinary system: urethral meatus normal and bladder without fullness, nontender Vaginal: normal without tenderness, induration or masses Cervix: normal appearance.  IUD string visible Adnexa: normal bimanual exam Uterus: anteverted and non-tender, normal size   Lab Review Urine pregnancy test Labs reviewed yes Radiologic studies reviewed no  I have spent a total of 20 minutes of face-to-face time, excluding clinical staff time, reviewing notes and preparing to see patient, ordering tests and/or medications, and counseling the patient.    Assessment:   1. Encounter  60for routine gynecological examination  - doing well  2. Vaginal discharge Rx: - Cervicovaginal ancillary only( [facility])  3. Encounter for routine checking of intrauterine contraceptive device (IUD) - pleased with IUD    Plan:    Education reviewed: calcium supplements, depression evaluation, low fat, low cholesterol diet, safe sex/STD prevention, self breast exams, and weight bearing exercise. Follow up in: 1 year.     Brock Bad, MD 08/19/2021 10:07 AM

## 2021-08-20 LAB — CERVICOVAGINAL ANCILLARY ONLY
Bacterial Vaginitis (gardnerella): NEGATIVE
Candida Glabrata: NEGATIVE
Candida Vaginitis: NEGATIVE
Chlamydia: NEGATIVE
Comment: NEGATIVE
Comment: NEGATIVE
Comment: NEGATIVE
Comment: NEGATIVE
Comment: NEGATIVE
Comment: NORMAL
Neisseria Gonorrhea: NEGATIVE
Trichomonas: NEGATIVE

## 2021-08-27 LAB — CYTOLOGY - PAP

## 2021-08-28 ENCOUNTER — Telehealth: Payer: Self-pay | Admitting: *Deleted

## 2021-08-28 NOTE — Progress Notes (Signed)
TC to patient to discuss abnormal Pap and need to schedule colposcopy. Patient encouraged to set up MyChart so additional information on procedure can be sent. MyChart text link sent with patient's permission. Patient call transferred to front office for scheduling.

## 2021-08-28 NOTE — Telephone Encounter (Signed)
Notified of abnormal Pap and need to schedule colposcopy.

## 2021-08-28 NOTE — Telephone Encounter (Signed)
TC to patient to notify that colpo is not covered by [facility]. Patient advised that BCCEP program would contact her to determine eligibility. Patient voices understanding.

## 2021-12-22 IMAGING — US US PELVIS COMPLETE WITH TRANSVAGINAL
1 series · 15 of 25 positions shown · non-contrast
Comparison: None

CLINICAL DATA: Pelvic pain.  IUD.

EXAM:
TRANSABDOMINAL AND TRANSVAGINAL ULTRASOUND OF PELVIS
TECHNIQUE: Both transabdominal and transvaginal ultrasound examinations of the
pelvis were performed. Transabdominal technique was performed for
global imaging of the pelvis including uterus, ovaries, adnexal
regions, and pelvic cul-de-sac. It was necessary to proceed with
endovaginal exam following the transabdominal exam to visualize the
IUD and ovaries.

[Series 1: us pelvis complete with transvaginal · 85 acquisitions, 15 frames shown]
[im 1/85]
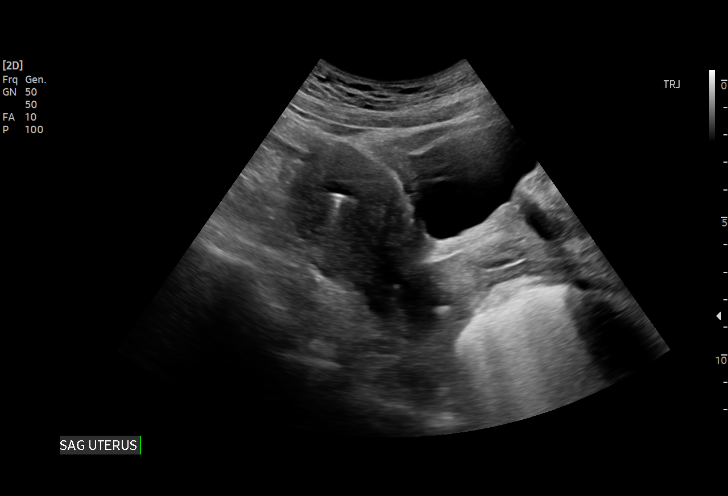
[im 8/85]
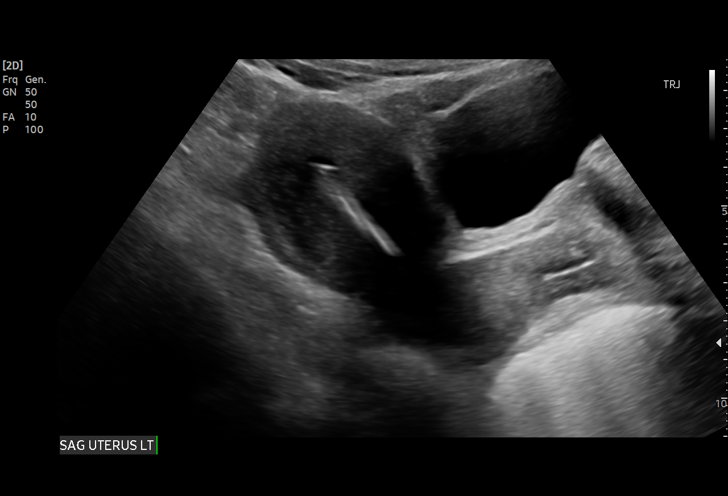
[im 15/85]
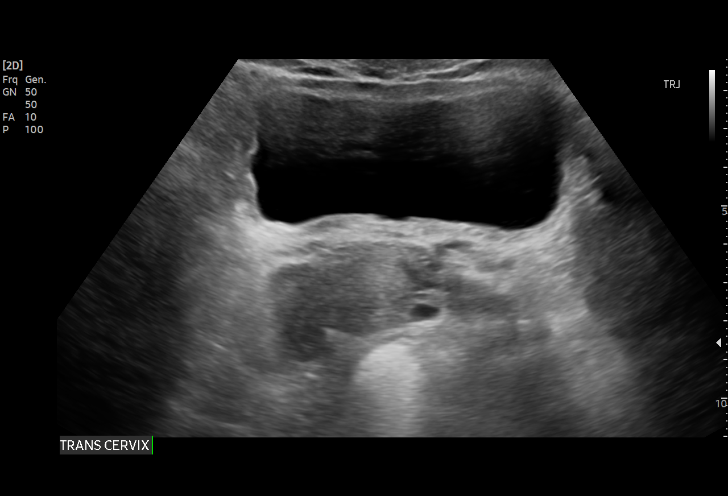
[im 18/85]
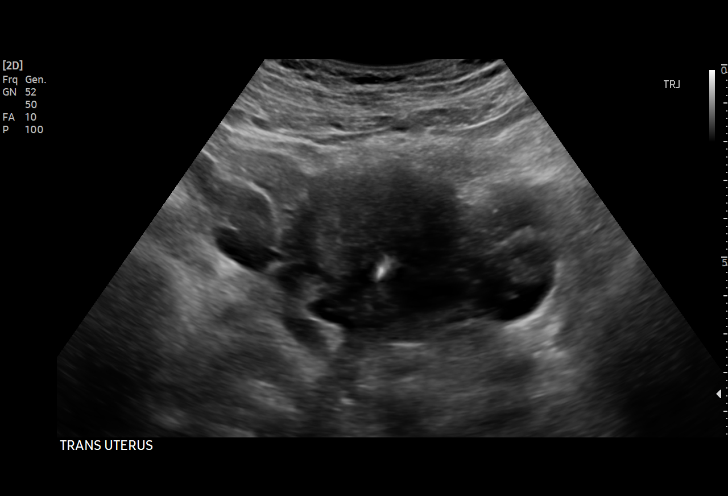
[im 25/85]
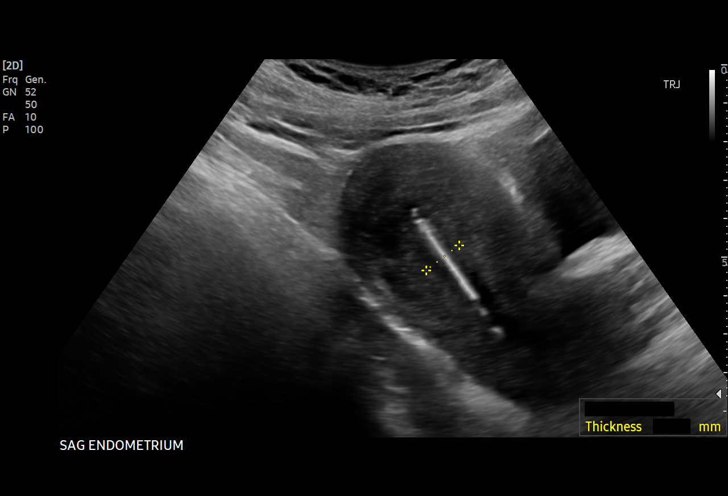
[im 32/85]
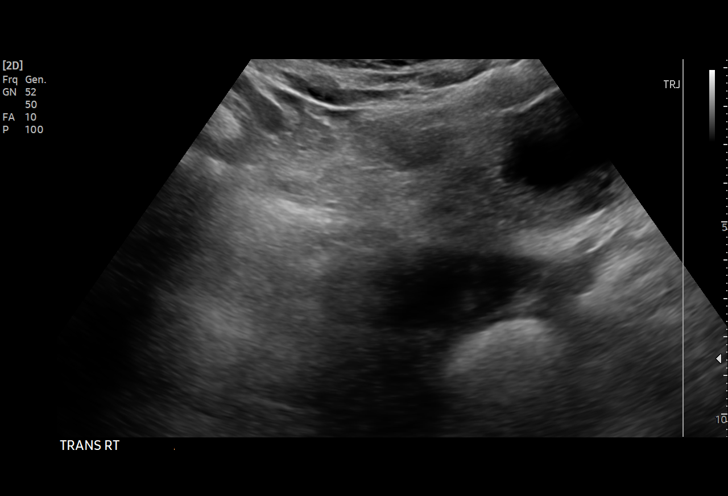
[im 36/85]
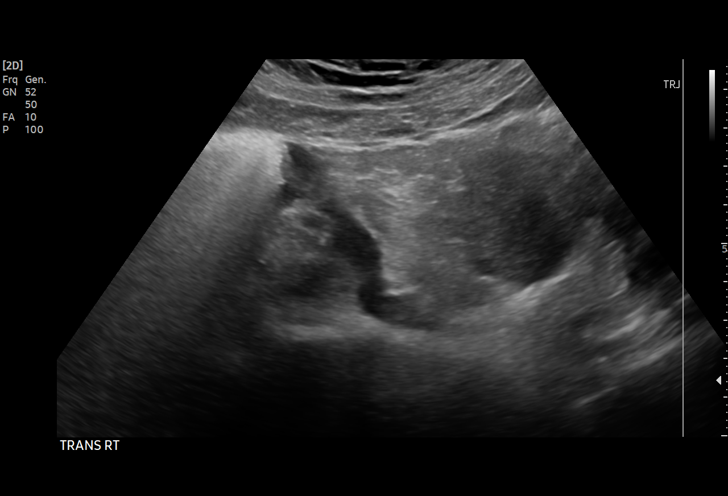
[im 43/85]
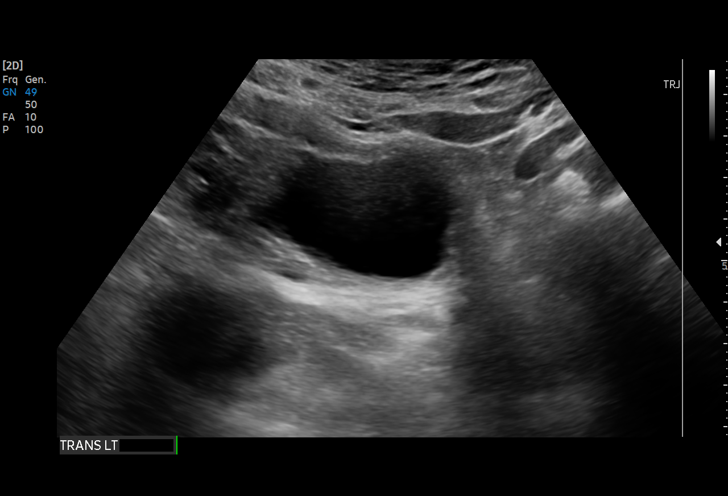
[im 50/85]
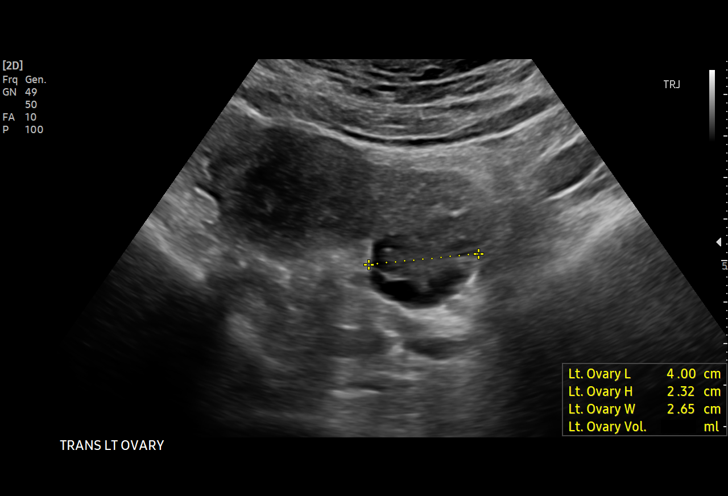
[im 53/85]
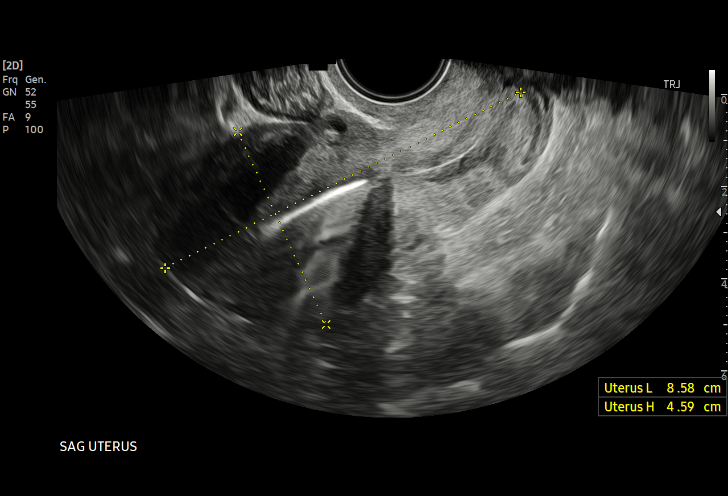
[im 60/85]
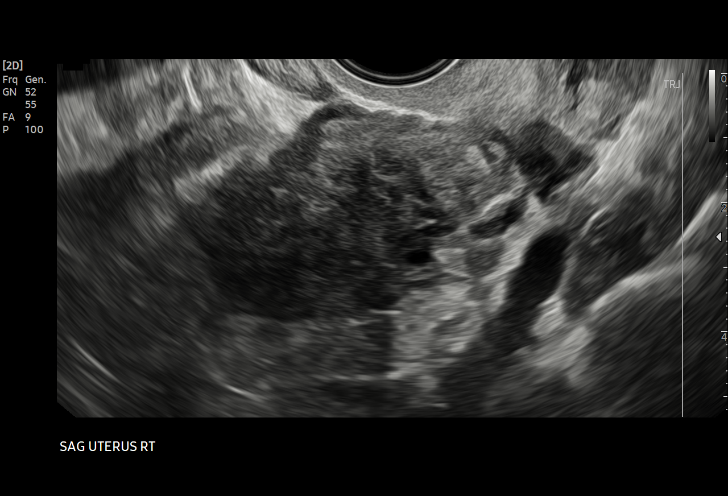
[im 67/85]
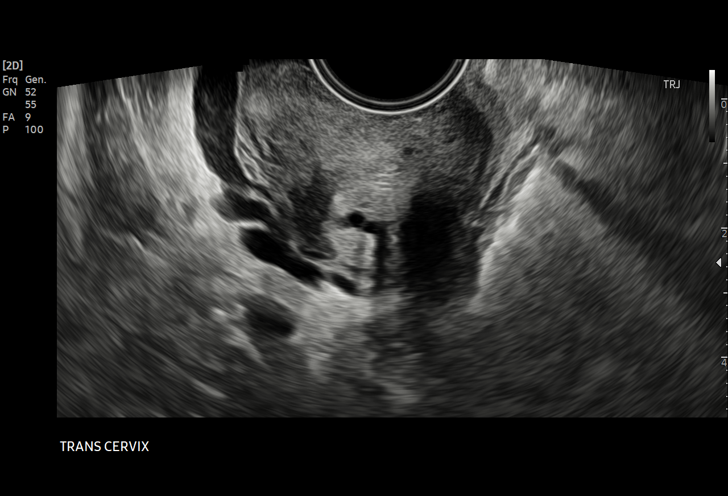
[im 71/85]
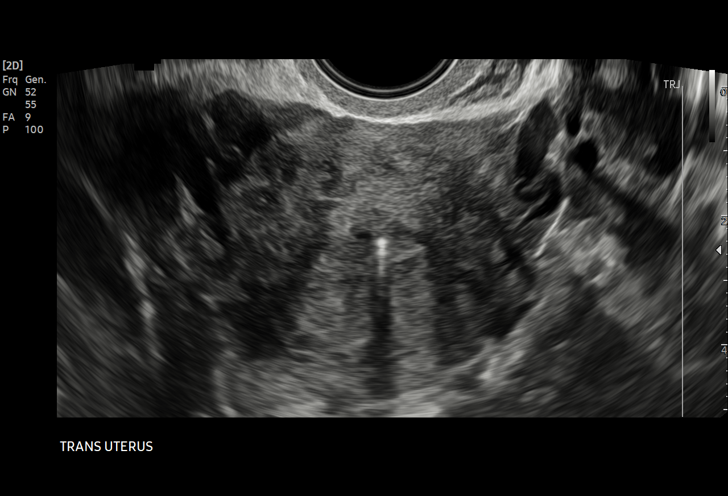
[im 78/85]
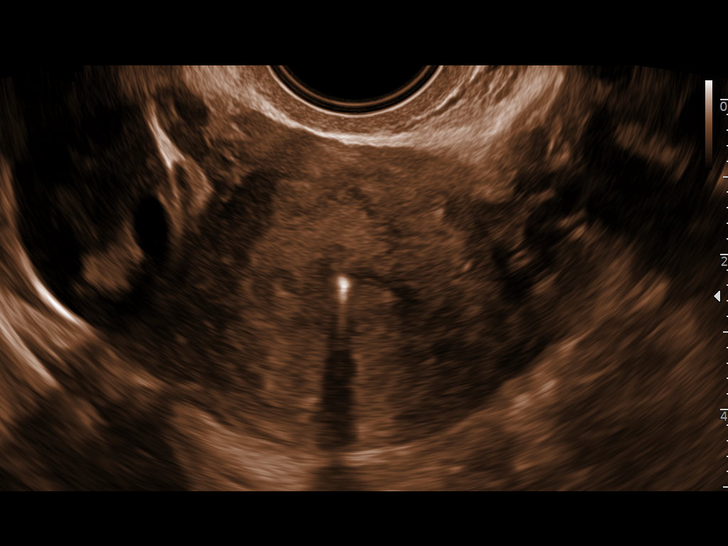
[im 85/85]
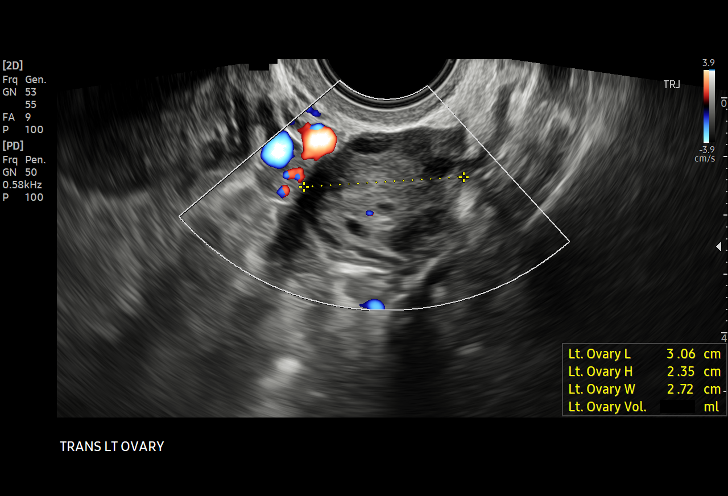

[15 of 25 positions shown; findings below may reference images not displayed]

FINDINGS: Uterus

Measurements: 8.9 x 4.6 x 5.2 cm = volume: 107 mL. Heterogeneous
echogenicity of myometrium noted, however no distinct fibroids or
other mass visualized.

Endometrium

Thickness: 14 mm. IUD seen in expected position in endometrial
cavity.

Right ovary

Measurements: 3.4 x 2.0 x 2.6 cm = volume: 9.2 mL. Normal
appearance/no adnexal mass.

Left ovary

Measurements: 3.1 x 2.4 x 2.7 cm = volume: 10.3 mL. Normal
appearance/no adnexal mass.

Other findings

No abnormal free fluid.
IMPRESSION: IUD in expected position in endometrial cavity.

Diffusely heterogeneous uterine myometrium, suspicious for
adenomyosis. No fibroids identified.

Normal appearance of both ovaries.

## 2022-09-15 ENCOUNTER — Ambulatory Visit (INDEPENDENT_AMBULATORY_CARE_PROVIDER_SITE_OTHER): Payer: Medicaid Other | Admitting: Advanced Practice Midwife

## 2022-09-15 ENCOUNTER — Encounter: Payer: Self-pay | Admitting: Advanced Practice Midwife

## 2022-09-15 ENCOUNTER — Other Ambulatory Visit (HOSPITAL_COMMUNITY)
Admission: RE | Admit: 2022-09-15 | Discharge: 2022-09-15 | Disposition: A | Discharge status: Home / Self Care | Payer: Medicaid Other | Source: Ambulatory Visit | Attending: Advanced Practice Midwife | Admitting: Advanced Practice Midwife

## 2022-09-15 VITALS — BP 115/67 | HR 83 | Ht 60.0 in | Wt 152.0 lb

## 2022-09-15 DIAGNOSIS — Z01419 Encounter for gynecological examination (general) (routine) without abnormal findings: Secondary | ICD-10-CM | POA: Diagnosis not present

## 2022-09-15 DIAGNOSIS — Z124 Encounter for screening for malignant neoplasm of cervix: Secondary | ICD-10-CM | POA: Diagnosis present

## 2022-09-15 DIAGNOSIS — Z975 Presence of (intrauterine) contraceptive device: Secondary | ICD-10-CM | POA: Insufficient documentation

## 2022-09-15 DIAGNOSIS — Z5941 Food insecurity: Secondary | ICD-10-CM

## 2022-09-15 DIAGNOSIS — Z30431 Encounter for routine checking of intrauterine contraceptive device: Secondary | ICD-10-CM | POA: Diagnosis not present

## 2022-09-15 DIAGNOSIS — R87612 Low grade squamous intraepithelial lesion on cytologic smear of cervix (LGSIL): Secondary | ICD-10-CM

## 2022-09-15 NOTE — Progress Notes (Signed)
   Subjective:     Destiny Horn is a 31 y.o. female here at [facility] for a routine exam.  Current complaints: dizziness with menses, reports food insecurity related to recent cut in hours at her job.  Personal health questionnaire reviewed: yes.  Do you have a primary care provider? yes Do you feel safe at home? yes  Elaine Office Visit from 09/15/2022 in [place]  PHQ-2 Total Score 0       Health Maintenance Due  Topic Date Due   COVID-19 Vaccine (1) Never done   Hepatitis C Screening  Never done   INFLUENZA VACCINE  Never done     Risk factors for chronic health problems: Smoking: never Alchohol/how much: occasional Pt BMI: Body mass index is 29.69 kg/m.   Gynecologic History Patient's last menstrual period was 09/11/2022 (exact date). Contraception: IUD Last Pap: 08/19/2021. Results were: LSIL. Colpo recommended but not completed. Last mammogram: n/a.   Obstetric History OB History  Gravida Para Term Preterm AB Living  2 2 2  0 0 2  SAB IAB Ectopic Multiple Live Births  0 0 0 0 2    # Outcome Date GA Lbr Len/2nd Weight Sex Delivery Anes PTL Lv  2 Term 07/12/12 [redacted]w[redacted]d 03:06 / 00:12 6 lb 14.9 oz (3.144 kg) F Vag-Spont EPI  LIV     Birth Comments: WNL  1 Term 2011 [redacted]w[redacted]d 48:00 7 lb 6 oz (3.345 kg) M Vag-Spont EPI  LIV     The following portions of the patient's history were reviewed and updated as appropriate: allergies, current medications, past family history, past medical history, past social history, past surgical history, and problem list.  Review of Systems Pertinent items noted in HPI and remainder of comprehensive ROS otherwise negative.    Objective:   BP 115/67   Pulse 83   Ht 5' (1.524 m)   Wt 152 lb (68.9 kg)   LMP 09/11/2022 (Exact Date)   BMI 29.69 kg/m  VS reviewed, nursing note reviewed,  Constitutional: well developed, well nourished, no distress HEENT: normocephalic CV: normal rate Pulm/chest  wall: normal effort Breast Exam:  Deferred with low risks and shared decision making, discussed recommendation to start mammogram between 40-50 yo/ Abdomen: soft Neuro: alert and oriented x 3 Skin: warm, dry Psych: affect normal Pelvic exam: Performed: Cervix pink, visually closed, without lesion, scant white creamy discharge, vaginal walls and external genitalia normal Bimanual exam: Cervix 0/long/high, firm, anterior, neg CMT, uterus nontender, nonenlarged, adnexa without tenderness, enlargement, or mass       Assessment/Plan:  1. Women's annual routine gynecological examination --IUD for contraception, regular light menses  - CBC  2. Screening for cervical cancer  - Cytology - PAP( [place])  3. Food insecurity --Pt reports that her hours have been cut at work and she is not able to buy enough food.   - AMBULATORY REFERRAL TO [facility]  4. LGSIL of cervix of undetermined significance --LSIL with hpv in 2022, colpo recommended. Follow up was done with [facility]CP to cover colpo but procedure was never done. Pt not aware of abnormal Pap when discussed in the office today. --Consult Dr Elly Modena. Abnormal Pap >1 year ago, so repeat Pap in office today.  F/U as needed.     Return in about 1 year (around 09/16/2023) for annual exam.   Fatima Blank, CNM 12:19 PM

## 2022-09-15 NOTE — Progress Notes (Signed)
31 y.o GYN presents for AEX/PAP.  C/o dizziness during period.  Last PAP 08/19/21 LSIL

## 2022-09-18 LAB — CYTOLOGY - PAP
Chlamydia: NEGATIVE
Comment: NEGATIVE
Comment: NEGATIVE
Comment: NORMAL
Diagnosis: NEGATIVE
Diagnosis: REACTIVE
High risk HPV: NEGATIVE
Neisseria Gonorrhea: NEGATIVE

## 2023-03-23 ENCOUNTER — Other Ambulatory Visit: Payer: Self-pay

## 2023-03-23 ENCOUNTER — Emergency Department (HOSPITAL_COMMUNITY)
Admission: EM | Admit: 2023-03-23 | Discharge: 2023-03-23 | Disposition: A | Discharge status: Home / Self Care | Payer: Medicaid Other | Attending: Emergency Medicine | Admitting: Emergency Medicine

## 2023-03-23 ENCOUNTER — Encounter (HOSPITAL_COMMUNITY): Payer: Self-pay | Admitting: Emergency Medicine

## 2023-03-23 DIAGNOSIS — R21 Rash and other nonspecific skin eruption: Secondary | ICD-10-CM | POA: Insufficient documentation

## 2023-03-23 MED ORDER — CLOTRIMAZOLE-BETAMETHASONE 1-0.05 % EX CREA: TOPICAL_CREAM | CUTANEOUS | 0 refills | Status: AC

## 2023-03-23 MED ORDER — HYDROXYZINE HCL 25 MG PO TABS: 25.0000 mg | ORAL_TABLET | Freq: Four times a day (QID) | ORAL | 0 refills | Status: AC | PRN

## 2023-03-23 NOTE — ED Provider Notes (Signed)
[facility]-EMERGENCY DEPT Provider Note: Destiny Dell, MD, FACEP  CSN: [id number] MRN: [id number] ARRIVAL: 03/23/23 at 0442 ROOM: [facility]   CHIEF COMPLAINT  Rash   HISTORY OF PRESENT ILLNESS  03/23/23 4:59 AM Destiny Horn is a 32 y.o. female who is had a pruritic, painful and erythematous rash underneath and between her breasts since the beginning of this month.  She attributes this to washing her brassiere's and seeing black spots on the.  None of her other close appeared to have these black spots and this rash has not appeared in any other part of her body.  She has discarded her brassiere's and is currently not wearing 1 due to discomfort.  She is here this morning because symptoms worsened.  She denies any systemic symptoms such as fever, chills or malaise.  She has no known allergies.   Past Medical History:  Diagnosis Date   No pertinent past medical history     Past Surgical History:  Procedure Laterality Date   NO PAST SURGERIES      Family History  Problem Relation Age of Onset   Healthy Mother    Healthy Father    Anesthesia problems Neg Hx    Hypotension Neg Hx    Malignant hyperthermia Neg Hx    Pseudochol deficiency Neg Hx     Social History   Tobacco Use   Smoking status: Never   Smokeless tobacco: Never  Vaping Use   Vaping Use: Never used  Substance Use Topics   Alcohol use: Yes    Alcohol/week: 0.0 standard drinks of alcohol    Comment: occasionally   Drug use: No    Prior to Admission medications   Medication Sig Start Date End Date Taking? Authorizing Provider  clotrimazole-betamethasone (LOTRISONE) cream Apply to affected area 2 times daily. 03/23/23  Yes Jeb Schloemer, MD  hydrOXYzine (ATARAX) 25 MG tablet Take 1-2 tablets (25-50 mg total) by mouth every 6 (six) hours as needed for itching (may cause drowsiness). 03/23/23  Yes Keviana Guida, MD    Allergies Patient has no known allergies.   REVIEW OF SYSTEMS  Negative except as  noted here or in the History of Present Illness.   PHYSICAL EXAMINATION  Initial Vital Signs Blood pressure 125/67, pulse 90, temperature 97.7 F (36.5 C), temperature source Oral, resp. rate 18, weight 68 kg, SpO2 98 %.  Examination General: Well-developed, well-nourished female in no acute distress; appearance consistent with age of record HENT: normocephalic; atraumatic Eyes: Normal appearance Neck: supple Heart: regular rate and rhythm Lungs: clear to auscultation bilaterally Abdomen: soft; nondistended; nontender; bowel sounds present Extremities: No deformity; full range of motion Neurologic: Awake, alert and oriented; motor function intact in all extremities and symmetric; no facial droop Skin: Warm and dry; erythematous, tender rash between and below the breasts:    Psychiatric: Normal mood and affect   RESULTS  Summary of this visit's results, reviewed and interpreted by myself:   EKG Interpretation  Date/Time:    Ventricular Rate:    PR Interval:    QRS Duration:   QT Interval:    QTC Calculation:   R Axis:     Text Interpretation:         Laboratory Studies: No results found for this or any previous visit (from the past 24 hour(s)). Imaging Studies: No results found.  ED COURSE and MDM  Nursing notes, initial and subsequent vitals signs, including pulse oximetry, reviewed and interpreted by myself.  Vitals:   03/23/23  0448 03/23/23 0452  BP:  125/67  Pulse:  90  Resp:  18  Temp:  97.7 F (36.5 C)  TempSrc:  Oral  SpO2:  98%  Weight: 68 kg    Medications - No data to display  The cause of the rash is unclear.  It appears to be a dermatitis but it does not appear in all of the places a brassiere would contact.  She does admit to air drying her procerus rather than electrically drying them so it is possible they were contaminated with a fungus that would otherwise have been killed in the dryer.  It is more intertriginous and thus suggestive of  dermatophyte infection.  If it is a dermatophyte infection as topical steroid would likely only make it worse.  We will therefore try Lotrisone which contains both this topical steroid and topical antifungal.  She will need formal dermatology consult if this persists.  PROCEDURES  Procedures   ED DIAGNOSES     ICD-10-CM   1. Rash  R21          Krystelle Prashad, Jonny Ruiz, MD 03/23/23 [phone]

## 2023-03-23 NOTE — ED Triage Notes (Signed)
Pt presents for rash since the beginning of April which she believes is from her bras. Rash is central chest and under breasts. Endorses itching Denies fever

## 2023-08-12 ENCOUNTER — Ambulatory Visit: Payer: Medicaid Other | Admitting: Obstetrics and Gynecology

## 2023-08-26 ENCOUNTER — Ambulatory Visit: Payer: Medicaid Other | Admitting: Obstetrics & Gynecology

## 2023-09-03 ENCOUNTER — Ambulatory Visit: Payer: Medicaid Other | Admitting: Obstetrics and Gynecology

## 2023-09-03 VITALS — BP 111/66 | HR 84 | Wt 152.6 lb

## 2023-09-03 DIAGNOSIS — Z30432 Encounter for removal of intrauterine contraceptive device: Secondary | ICD-10-CM | POA: Diagnosis not present

## 2023-09-03 NOTE — Progress Notes (Signed)
    GYNECOLOGY OFFICE PROCEDURE NOTE  Destiny Horn is a 32 y.o. [id number] here for  IUD removal. No GYN concerns.  Last pap smear was on 09/15/22 and was normal.  IUD Removal  Patient identified, informed consent performed, consent signed.  Patient was in the dorsal lithotomy position, normal external genitalia was noted.  A speculum was placed in the patient's vagina, normal discharge was noted, no lesions. The cervix was visualized, no lesions, no abnormal discharge.  The strings of the IUD were grasped and pulled using ring forceps. The IUD was removed in its entirety.  Patient tolerated the procedure well.    Patient does not desire any new birth control at this time.  She has annual exam 11/24   Mariel Aloe, MD, FACOG Obstetrician & Gynecologist, [facility] for [facility], [facility]

## 2023-09-03 NOTE — Progress Notes (Signed)
Pt is in the office for IUD removal, inserted in 2021 Declines wanting any other BC at this time

## 2023-10-07 ENCOUNTER — Ambulatory Visit: Payer: Medicaid Other | Admitting: Obstetrics and Gynecology

## 2023-10-07 ENCOUNTER — Encounter: Payer: Self-pay | Admitting: Obstetrics and Gynecology

## 2023-10-07 ENCOUNTER — Other Ambulatory Visit (HOSPITAL_COMMUNITY)
Admission: RE | Admit: 2023-10-07 | Discharge: 2023-10-07 | Disposition: A | Discharge status: Home / Self Care | Payer: Medicaid Other | Source: Ambulatory Visit | Attending: Obstetrics and Gynecology | Admitting: Obstetrics and Gynecology

## 2023-10-07 VITALS — BP 110/72 | HR 91 | Wt 150.0 lb

## 2023-10-07 DIAGNOSIS — Z01419 Encounter for gynecological examination (general) (routine) without abnormal findings: Secondary | ICD-10-CM

## 2023-10-07 NOTE — Progress Notes (Signed)
GYNECOLOGY ANNUAL PREVENTATIVE CARE ENCOUNTER NOTE  History:     Destiny Horn is a 32 y.o. [id number] female here for a routine annual gynecologic exam.  Current complaints: none.   Denies abnormal vaginal bleeding, discharge, pelvic pain, problems with intercourse or other gynecologic concerns.    Gynecologic History Patient's last menstrual period was 10/07/2023. Contraception: none Last Pap: 09/15/22. Results were: normal with negative HPV Last mammogram: mammogram  Obstetric History OB History  Gravida Para Term Preterm AB Living  2 2 2  0 0 2  SAB IAB Ectopic Multiple Live Births  0 0 0 0 2    # Outcome Date GA Lbr Len/2nd Weight Sex Type Anes PTL Lv  2 Term 07/12/12 [redacted]w[redacted]d 03:06 / 00:12 6 lb 14.9 oz (3.144 kg) F Vag-Spont EPI  LIV     Birth Comments: WNL  1 Term 2011 [redacted]w[redacted]d 48:00 7 lb 6 oz (3.345 kg) M Vag-Spont EPI  LIV    Past Medical History:  Diagnosis Date   No pertinent past medical history     Past Surgical History:  Procedure Laterality Date   NO PAST SURGERIES      Current Outpatient Medications on File Prior to Visit  Medication Sig Dispense Refill   clotrimazole-betamethasone (LOTRISONE) cream Apply to affected area 2 times daily. (Patient not taking: Reported on 09/03/2023) 45 g 0   hydrOXYzine (ATARAX) 25 MG tablet Take 1-2 tablets (25-50 mg total) by mouth every 6 (six) hours as needed for itching (may cause drowsiness). (Patient not taking: Reported on 09/03/2023) 30 tablet 0   Current Facility-Administered Medications on File Prior to Visit  Medication Dose Route Frequency Provider Last Rate Last Admin   paragard intrauterine copper IUD   Intrauterine Once Brock Bad, MD        No Known Allergies  Social History:  reports that she has never smoked. She has never used smokeless tobacco. She reports current alcohol use. She reports that she does not use drugs.  Family History  Problem Relation Age of Onset   Healthy Mother    Healthy  Father    Anesthesia problems Neg Hx    Hypotension Neg Hx    Malignant hyperthermia Neg Hx    Pseudochol deficiency Neg Hx     The following portions of the patient's history were reviewed and updated as appropriate: allergies, current medications, past family history, past medical history, past social history, past surgical history and problem list.  Review of Systems Pertinent items noted in HPI and remainder of comprehensive ROS otherwise negative.  Physical Exam:  BP 110/72   Pulse 91   Wt 150 lb (68 kg)   LMP 10/07/2023   BMI 29.29 kg/m  CONSTITUTIONAL: Well-developed, well-nourished female in no acute distress.  HENT:  Normocephalic, atraumatic, External right and left ear normal. Oropharynx is clear and moist EYES: Conjunctivae and EOM are normal. NECK: Normal range of motion, supple, no masses.  Normal thyroid.  SKIN: Skin is warm and dry. No rash noted. Not diaphoretic. No erythema. No pallor. MUSCULOSKELETAL: Normal range of motion. No tenderness.  No cyanosis, clubbing, or edema.  2+ distal pulses. NEUROLOGIC: Alert and oriented to person, place, and time. Normal reflexes, muscle tone coordination.  PSYCHIATRIC: Normal mood and affect. Normal behavior. Normal judgment and thought content. CARDIOVASCULAR: Normal heart rate noted, regular rhythm RESPIRATORY: Clear to auscultation bilaterally. Effort and breath sounds normal, no problems with respiration noted. BREASTS: Symmetric in size. No masses, tenderness, skin changes,  nipple drainage, or lymphadenopathy bilaterally. Performed in the presence of a chaperone. ABDOMEN: Soft, no distention noted.  No tenderness, rebound or guarding.  PELVIC: Normal appearing external genitalia and urethral meatus; normal appearing vaginal mucosa and cervix.  No abnormal discharge noted.  Pap smear obtained.  Normal uterine size, no other palpable masses, no uterine or adnexal tenderness.  Performed in the presence of a chaperone.    Assessment and Plan:    1. Women's annual routine gynecological examination Normal annual exam - Cytology - PAP( [place])  Will follow up results of pap smear and manage accordingly. Routine preventative health maintenance measures emphasized. Please refer to After Visit Summary for other counseling recommendations.      Mariel Aloe, MD, FACOG Obstetrician & Gynecologist, [facility] for [facility], [facility]

## 2023-10-07 NOTE — Progress Notes (Signed)
Pt is in the office for annual LMP 10/07/23  Last pap 09/15/2022 Pt complains of intermittent vaginal itching Declines BC

## 2023-10-13 LAB — CYTOLOGY - PAP
Comment: NEGATIVE
Diagnosis: NEGATIVE
High risk HPV: NEGATIVE

## 2024-10-20 ENCOUNTER — Other Ambulatory Visit (HOSPITAL_COMMUNITY)
Admission: RE | Admit: 2024-10-20 | Discharge: 2024-10-20 | Disposition: A | Discharge status: Home / Self Care | Payer: Self-pay | Source: Ambulatory Visit | Attending: Obstetrics and Gynecology | Admitting: Obstetrics and Gynecology

## 2024-10-20 ENCOUNTER — Ambulatory Visit: Admitting: Obstetrics and Gynecology

## 2024-10-20 ENCOUNTER — Encounter: Payer: Self-pay | Admitting: Obstetrics and Gynecology

## 2024-10-20 VITALS — BP 132/72 | HR 87 | Ht 60.0 in | Wt 164.0 lb

## 2024-10-20 DIAGNOSIS — Z113 Encounter for screening for infections with a predominantly sexual mode of transmission: Secondary | ICD-10-CM | POA: Insufficient documentation

## 2024-10-20 DIAGNOSIS — Z3202 Encounter for pregnancy test, result negative: Secondary | ICD-10-CM

## 2024-10-20 DIAGNOSIS — Z01419 Encounter for gynecological examination (general) (routine) without abnormal findings: Secondary | ICD-10-CM

## 2024-10-20 DIAGNOSIS — Z124 Encounter for screening for malignant neoplasm of cervix: Secondary | ICD-10-CM

## 2024-10-20 DIAGNOSIS — Z30016 Encounter for initial prescription of transdermal patch hormonal contraceptive device: Secondary | ICD-10-CM

## 2024-10-20 LAB — POCT URINE PREGNANCY: Preg Test, Ur: NEGATIVE

## 2024-10-20 MED ORDER — NORELGESTROMIN-ETH ESTRADIOL 150-35 MCG/24HR TD PTWK: 1.0000 | MEDICATED_PATCH | TRANSDERMAL | 12 refills | Status: DC

## 2024-10-20 NOTE — Progress Notes (Signed)
 Pt presents for AEX Last PAP 10-07-2023 Requesting PAP and STD testing  Pt interested in [facility] patch for temporary use.

## 2024-10-20 NOTE — Progress Notes (Signed)
 ANNUAL EXAM Patient name: Destiny Horn MRN [id number]  Date of birth: 02/16/1991 Chief Complaint:   Annual exam and birth control  History of Present Illness:   Destiny Horn is a 33 y.o. [phone]  female being seen today for a routine annual exam.  Current complaints: desires birth control for temporary use Desires STD screening   Patient's last menstrual period was 10/03/2024 (approximate).  Gynecologic History Contraception: n/a Last Pap:10/07/2023 . Results were: NILM Last mammogram: n/a     10/20/2024    3:42 PM 10/07/2023    1:19 PM 09/15/2022    9:43 AM 07/27/2018    9:02 AM  Depression screen PHQ 2/9  Decreased Interest 0 0 0 3  Down, Depressed, Hopeless 0 0 0 1  PHQ - 2 Score 0 0 0 4  Altered sleeping 0 0 0 1  Tired, decreased energy 0 1 1 2   Change in appetite 0 0 0 1  Feeling bad or failure about yourself  0 2 0 0  Trouble concentrating 0 0 1 1  Moving slowly or fidgety/restless 0 0 0 1  Suicidal thoughts 0 0 0 0  PHQ-9 Score 0 3  2  10    Difficult doing work/chores   Not difficult at all      Data saved with a previous flowsheet row definition        10/20/2024    3:42 PM 10/07/2023    1:20 PM 06/27/2019   10:16 AM 07/27/2018    9:02 AM  GAD 7 : Generalized Anxiety Score  Nervous, Anxious, on Edge 0 0 0 3  Control/stop worrying 0 1 1 3   Worry too much - different things 0 1 1 3   Trouble relaxing 0 1 1 3   Restless 0 0 0 0  Easily annoyed or irritable 0 0 0 2  Afraid - awful might happen 0 1 0 3  Total GAD 7 Score 0 4 3 17      Review of Systems:   Pertinent items are noted in HPI Denies any headaches, blurred vision, fatigue, shortness of breath, chest pain, abdominal pain, abnormal vaginal discharge/itching/odor/irritation, problems with periods, bowel movements, urination, or intercourse unless otherwise stated above. Pertinent History Reviewed:  Reviewed past medical,surgical, social and family history.  Reviewed problem list,  medications and allergies. Physical Assessment:   Vitals:   10/20/24 1535  BP: 132/72  Pulse: 87  Weight: 164 lb (74.4 kg)  Height: 5' (1.524 m)  Body mass index is 32.03 kg/m.        Physical Examination:   General appearance - well appearing, and in no distress  Mental status - alert, oriented   Psych:  She has a normal mood and affect  Skin - warm and dry, normal color  Chest - effort normal, all lung fields clear to auscultation bilaterally  Heart - normal rate and regular rhythm  Neck:  midline trachea  Breasts - breasts appear normal, no suspicious masses, no skin or nipple changes or  axillary nodes  Abdomen - soft, nontender, nondistended  Pelvic - VULVA: normal appearing vulva with no masses, tenderness or lesions  VAGINA: normal appearing vagina with normal color and discharge, no lesions  CERVIX: normal appearing cervix without discharge or lesions, no CMT  Thin prep pap is done w HR HPV cotesting  Extremities:  No swelling or varicosities noted  Chaperone present for exam  No results found for this or any previous visit (from the past 24 hours).  Assessment & Plan:  1. Encounter for annual routine gynecological examination (Primary) Discussed guidelines for pap smear, desires pap smear today  Encouraged self breast exam  2. Screen for STD (sexually transmitted disease) Desires screening today - Cervicovaginal ancillary only( [place]) - HIV antibody (with reflex) - RPR - Hepatitis B Surface AntiGEN - Hepatitis C Antibody  3. Cervical cancer screening Discussed ASCCP guidelines for future pap smear - Cytology - PAP( [place])  4. Encounter for initial prescription of transdermal patch hormonal contraceptive device Discussed options for birth control including, pills, patch, ring, injection, desires patch today, discussed initiation, back up, expected bleeding, side effects  No contraindications at this time UPT neg - POCT urine  pregnancy   Labs/procedures today:   Mammogram: @ 32yo, or sooner if problems Colonoscopy: @ 32yo, or sooner if problems  Orders Placed This Encounter  Procedures   HIV antibody (with reflex)   RPR   Hepatitis B Surface AntiGEN   Hepatitis C Antibody   POCT urine pregnancy    Meds:  Meds ordered this encounter  Medications   DISCONTD: norelgestromin -ethinyl estradiol  (XULANE) 150-35 MCG/24HR transdermal patch    Sig: Place 1 patch onto the skin once a week.    Dispense:  3 patch    Refill:  12   norelgestromin -ethinyl estradiol  (XULANE) 150-35 MCG/24HR transdermal patch    Sig: Place 1 patch onto the skin once a week. Place 1 patch onto the skin once a week for 3 weeks. Remove patch on the 4th week and go patch free for one week. Change the application site weekly    Dispense:  3 patch    Refill:  12    Follow-up: Return in about 1 year (around 10/20/2025) for [place] Nidia Delores, FNP

## 2024-10-21 LAB — HEPATITIS B SURFACE ANTIGEN: Hepatitis B Surface Ag: NEGATIVE

## 2024-10-21 LAB — HIV ANTIBODY (ROUTINE TESTING W REFLEX): HIV Screen 4th Generation wRfx: NONREACTIVE

## 2024-10-21 LAB — SYPHILIS: RPR W/REFLEX TO RPR TITER AND TREPONEMAL ANTIBODIES, TRADITIONAL SCREENING AND DIAGNOSIS ALGORITHM: RPR Ser Ql: NONREACTIVE

## 2024-10-21 LAB — HEPATITIS C ANTIBODY: Hep C Virus Ab: NONREACTIVE

## 2024-10-22 ENCOUNTER — Ambulatory Visit: Payer: Self-pay | Admitting: Obstetrics and Gynecology

## 2024-10-24 LAB — CERVICOVAGINAL ANCILLARY ONLY
Chlamydia: NEGATIVE
Comment: NEGATIVE
Comment: NEGATIVE
Comment: NORMAL
Neisseria Gonorrhea: NEGATIVE
Trichomonas: NEGATIVE

## 2024-10-25 LAB — CYTOLOGY - PAP
Comment: NEGATIVE
Diagnosis: NEGATIVE
High risk HPV: NEGATIVE

## 2024-11-09 ENCOUNTER — Ambulatory Visit: Admitting: Obstetrics and Gynecology

## 2024-11-09 ENCOUNTER — Encounter: Payer: Self-pay | Admitting: Obstetrics and Gynecology

## 2024-11-09 VITALS — BP 132/71 | HR 93 | Ht 60.0 in | Wt 156.6 lb

## 2024-11-09 DIAGNOSIS — Z30430 Encounter for insertion of intrauterine contraceptive device: Secondary | ICD-10-CM | POA: Diagnosis not present

## 2024-11-09 DIAGNOSIS — Z3202 Encounter for pregnancy test, result negative: Secondary | ICD-10-CM | POA: Diagnosis not present

## 2024-11-09 DIAGNOSIS — Z3009 Encounter for other general counseling and advice on contraception: Secondary | ICD-10-CM

## 2024-11-09 LAB — POCT URINE PREGNANCY: Preg Test, Ur: NEGATIVE

## 2024-11-09 MED ORDER — LEVONORGESTREL 20 MCG/DAY IU IUD
1.0000 | INTRAUTERINE_SYSTEM | Freq: Once | INTRAUTERINE | Status: AC
  Administered 2024-11-09: 1 via INTRAUTERINE

## 2024-11-09 NOTE — Patient Instructions (Signed)

## 2024-11-09 NOTE — Addendum Note (Signed)
 Addended by: LANG RIGGS A on: 11/09/2024 04:44 PM   Modules accepted: Orders

## 2024-11-09 NOTE — Progress Notes (Signed)
° °  GYNECOLOGY CLINIC PROCEDURE NOTE  Destiny Horn is a 33 y.o. [id number] here for Mirena IUD insertion. No GYN concerns.  Last pap smear was on 10/20/24 and was normal.  IUD Insertion Procedure Note Patient identified, informed consent performed, consent signed.   Discussed risks of irregular bleeding, cramping, infection, malpositioning or misplacement of the IUD outside the uterus which may require further procedure such as laparoscopy. Time out was performed.  Urine pregnancy test negative.  Speculum placed in the vagina.  Cervix visualized.  Cleaned with Betadine x 2.  Grasped anteriorly with a single tooth tenaculum.  Uterus sounded to 8 cm.  Mirena IUD placed per manufacturer's recommendations.  Strings trimmed to 3 cm. Tenaculum was removed, good hemostasis noted.  Patient tolerated procedure well.   Patient was given post-procedure instructions.  She was advised to have backup contraception for one week.  Patient was also asked to check IUD strings periodically and follow up in 4 weeks for IUD check.  Nidia Daring, FNP

## 2024-11-09 NOTE — Progress Notes (Signed)
 Currently on patch for birth control. Concerned patch keeps coming off in the shower. Has not had cycle since 10/03/24  Interested in discussing other birth control with provider.

## 2024-12-08 ENCOUNTER — Ambulatory Visit: Payer: Self-pay | Admitting: Obstetrics and Gynecology

## 2025-01-05 ENCOUNTER — Ambulatory Visit: Payer: Self-pay | Admitting: Obstetrics and Gynecology

## 2025-01-05 ENCOUNTER — Encounter: Payer: Self-pay | Admitting: Obstetrics and Gynecology

## 2025-01-05 VITALS — BP 114/63 | HR 83 | Ht 63.0 in | Wt 167.0 lb

## 2025-01-05 DIAGNOSIS — Z30431 Encounter for routine checking of intrauterine contraceptive device: Secondary | ICD-10-CM

## 2025-01-05 NOTE — Progress Notes (Signed)
" ° °  ESTABLISHED GYNECOLOGY VISIT No chief complaint on file.   Subjective:  Destiny Horn is a 34 y.o. [id number] presenting for IUD check after Mirena  IUD insertion on 10/2024  Reports no issues with bleeding, pain or cramping. Has not tried string checks.  Review of Systems:   Pertinent items are noted in HPI  Pertinent History Reviewed:  Reviewed past medical,surgical, social and family history.  Reviewed problem list, medications and allergies.  Objective:   Vitals:   01/05/25 1537  BP: 114/63  Pulse: 83  Weight: 167 lb (75.8 kg)  Height: 5' 3 (1.6 m)   Physical Examination:   General appearance - well appearing, and in no distress  Mental status - alert, oriented to person, place, and time  Psych:  normal mood and affect  Skin - warm and dry, normal color, no suspicious lesions noted  Abdomen - soft, nontender, nondistended, no masses or organomegaly  Pelvic -  VULVA: normal appearing vulva with no masses, tenderness or lesions   VAGINA: normal appearing vagina with normal color and discharge, no lesions   CERVIX: normal appearing cervix without discharge or lesions, IUD strings just barely visible at os with use of cytobrush  Extremities:  No swelling or varicosities noted  Chaperone present for exam  Assessment and Plan:  1. IUD check up (Primary) Likely that IUD strings have curled into the cervical canal but can't r/o malposition Recommend pelvic ultrasound to evaluate IUD location Patient has not been sexually active in the last week. Advised condoms as backup until IUD location confirmed - US  PELVIC COMPLETE WITH TRANSVAGINAL; Future    Rollo ONEIDA Bring, MD, FACOG Obstetrician & Gynecologist, [facility] for [facility], [facility]  "

## 2025-01-05 NOTE — Progress Notes (Signed)
 IUD string check

## 2025-01-09 ENCOUNTER — Observation Stay (HOSPITAL_COMMUNITY): Payer: Self-pay
